# Patient Record
Sex: Male | Born: 1990 | Race: Black or African American | Hispanic: No | Marital: Married | Smoking: Never smoker
Health system: Southern US, Community
[De-identification: ages and names within clinical notes are randomized; demographics above are authoritative.]

## PROBLEM LIST (undated history)

## (undated) DIAGNOSIS — I10 Essential (primary) hypertension: Secondary | ICD-10-CM

## (undated) DIAGNOSIS — R0600 Dyspnea, unspecified: Secondary | ICD-10-CM

## (undated) DIAGNOSIS — Q245 Malformation of coronary vessels: Secondary | ICD-10-CM

## (undated) HISTORY — PX: TONSILLECTOMY: SUR1361

## (undated) HISTORY — DX: Malformation of coronary vessels: Q24.5

---

## 2015-06-24 ENCOUNTER — Emergency Department (HOSPITAL_BASED_OUTPATIENT_CLINIC_OR_DEPARTMENT_OTHER): Payer: Managed Care, Other (non HMO)

## 2015-06-24 ENCOUNTER — Emergency Department (HOSPITAL_COMMUNITY)
Admission: EM | Admit: 2015-06-24 | Discharge: 2015-06-24 | Disposition: A | Discharge status: Home / Self Care | Payer: Managed Care, Other (non HMO) | Attending: Emergency Medicine | Admitting: Emergency Medicine

## 2015-06-24 ENCOUNTER — Encounter (HOSPITAL_COMMUNITY): Payer: Self-pay | Admitting: Emergency Medicine

## 2015-06-24 ENCOUNTER — Emergency Department (HOSPITAL_COMMUNITY): Payer: Managed Care, Other (non HMO)

## 2015-06-24 DIAGNOSIS — R079 Chest pain, unspecified: Secondary | ICD-10-CM | POA: Diagnosis present

## 2015-06-24 DIAGNOSIS — I10 Essential (primary) hypertension: Secondary | ICD-10-CM | POA: Diagnosis not present

## 2015-06-24 DIAGNOSIS — R55 Syncope and collapse: Secondary | ICD-10-CM | POA: Insufficient documentation

## 2015-06-24 DIAGNOSIS — R197 Diarrhea, unspecified: Secondary | ICD-10-CM | POA: Insufficient documentation

## 2015-06-24 HISTORY — DX: Essential (primary) hypertension: I10

## 2015-06-24 LAB — BASIC METABOLIC PANEL
Anion gap: 9 (ref 5–15)
BUN: 20 mg/dL (ref 6–20)
CHLORIDE: 106 mmol/L (ref 101–111)
CO2: 27 mmol/L (ref 22–32)
CREATININE: 1.06 mg/dL (ref 0.61–1.24)
Calcium: 9.6 mg/dL (ref 8.9–10.3)
Glucose, Bld: 95 mg/dL (ref 65–99)
Potassium: 4.2 mmol/L (ref 3.5–5.1)
SODIUM: 142 mmol/L (ref 135–145)

## 2015-06-24 LAB — CBC
HCT: 41.9 % (ref 39.0–52.0)
Hemoglobin: 14.4 g/dL (ref 13.0–17.0)
MCH: 31.3 pg (ref 26.0–34.0)
MCHC: 34.4 g/dL (ref 30.0–36.0)
MCV: 91.1 fL (ref 78.0–100.0)
PLATELETS: 235 10*3/uL (ref 150–400)
RBC: 4.6 MIL/uL (ref 4.22–5.81)
RDW: 12.2 % (ref 11.5–15.5)
WBC: 7.8 10*3/uL (ref 4.0–10.5)

## 2015-06-24 LAB — I-STAT TROPONIN, ED: TROPONIN I, POC: 0 ng/mL (ref 0.00–0.08)

## 2015-06-24 MED ORDER — NAPROXEN 500 MG PO TABS: 500.0000 mg | ORAL_TABLET | Freq: Two times a day (BID) | ORAL | Status: DC

## 2015-06-24 MED ORDER — NAPROXEN 500 MG PO TABS
500.0000 mg | ORAL_TABLET | Freq: Once | ORAL | Status: AC
  Administered 2015-06-24: 500 mg via ORAL
  Filled 2015-06-24: qty 1

## 2015-06-24 MED ORDER — KETOROLAC TROMETHAMINE 30 MG/ML IJ SOLN
30.0000 mg | Freq: Once | INTRAMUSCULAR | Status: AC
  Administered 2015-06-24: 30 mg via INTRAVENOUS
  Filled 2015-06-24: qty 1

## 2015-06-24 NOTE — ED Notes (Signed)
Pt made aware that we are still waiting on ECHO to be read by Cardiologist.  Verbalized understanding.

## 2015-06-24 NOTE — ED Provider Notes (Signed)
10 AM patient is resting comfortably. Complains of mild anterior chest pain, nonradiating. Declines pain medicine. 11:50 PM a.m. pain is unchanged however now requesting pain medicine. Naprosyn ordered. Plan prescription Naprosyn referral [facility] Diagnosis atypical chest pain Results for orders placed or performed during the hospital encounter of 06/24/15  Basic metabolic panel  Result Value Ref Range   Sodium 142 135 - 145 mmol/L   Potassium 4.2 3.5 - 5.1 mmol/L   Chloride 106 101 - 111 mmol/L   CO2 27 22 - 32 mmol/L   Glucose, Bld 95 65 - 99 mg/dL   BUN 20 6 - 20 mg/dL   Creatinine, Ser 1.611.06 0.61 - 1.24 mg/dL   Calcium 9.6 8.9 - 09.610.3 mg/dL   GFR calc non Af Amer >60 >60 mL/min   GFR calc Af Amer >60 >60 mL/min   Anion gap 9 5 - 15  CBC  Result Value Ref Range   WBC 7.8 4.0 - 10.5 K/uL   RBC 4.60 4.22 - 5.81 MIL/uL   Hemoglobin 14.4 13.0 - 17.0 g/dL   HCT 04.541.9 40.939.0 - 81.152.0 %   MCV 91.1 78.0 - 100.0 fL   MCH 31.3 26.0 - 34.0 pg   MCHC 34.4 30.0 - 36.0 g/dL   RDW 91.412.2 78.211.5 - 95.615.5 %   Platelets 235 150 - 400 K/uL  I-stat troponin, ED (not at [facility])  Result Value Ref Range   Troponin i, poc 0.00 0.00 - 0.08 ng/mL   Comment 3           Dg Chest 2 View  06/24/2015  CLINICAL DATA:  Mid left chest pain on and off for 2 weeks. EXAM: CHEST  2 VIEW COMPARISON:  None. FINDINGS: The heart size and mediastinal contours are within normal limits. Both lungs are clear. The visualized skeletal structures are unremarkable. IMPRESSION: Negative chest. Electronically Signed   By: Marnee SpringJonathon  Watts M.D.   On: 06/24/2015 07:11   Echocardiogram report reviewed  Doug SouSam Rithik Odea, MD 06/24/15 1153

## 2015-06-24 NOTE — ED Provider Notes (Addendum)
CSN: [id number]     Arrival date & time 06/24/15  0604 History   First MD Initiated Contact with Patient 06/24/15 [phone]     Chief Complaint  Patient presents with  . Chest Pain      HPI  Patient presents for evaluation of chest pain. He describes left-sided chest pain that is sharp and intermittent. States it feels like "someone is punched me in the chest in my heart". He states it is worse when he lays flat or on his side at night. He states "when I am standing or walking around I'm good".  Seen at [facility] on Friday, 5 days ago after an episode of syncope. He was getting out of his truck at work and had a syncopal episode. Essentially normal workup there. EKG showed LVH. No description of the RST changes. Not tachycardic. Symptoms thought to be orthostasis.  Patient had some diarrhea last weekend and early this week. States more than usual for him. He states he is lactose intolerant and occasional diarrhea feet as dietary indiscretion. However, he feels his diet was normal. No fevers or other infectious symptoms.  Does not have a cough or shortness of breath. He works Environmental managermoving furniture. Has not noticed fatigue.  Patient with a history of hypertension. Started on medications in 2010. 2013 was taken off of medications because of episodes of lightheadedness and low blood pressure readings.  He is not currently medicated.  Past Medical History  Diagnosis Date  . Hypertension    History reviewed. No pertinent past surgical history. Family History  Problem Relation Age of Onset  . Sickle cell anemia Father    Social History  Substance Use Topics  . Smoking status: Never Smoker   . Smokeless tobacco: None  . Alcohol Use: No    Review of Systems  Constitutional: Negative for fever, chills, diaphoresis, appetite change and fatigue.  HENT: Negative for mouth sores, sore throat and trouble swallowing.   Eyes: Negative for visual disturbance.  Respiratory: Negative for cough,  chest tightness, shortness of breath and wheezing.   Cardiovascular: Positive for chest pain.  Gastrointestinal: Negative for nausea, vomiting, abdominal pain, diarrhea and abdominal distention.  Endocrine: Negative for polydipsia, polyphagia and polyuria.  Genitourinary: Negative for dysuria, frequency and hematuria.  Musculoskeletal: Negative for gait problem.  Skin: Negative for color change, pallor and rash.  Neurological: Positive for syncope. Negative for dizziness, light-headedness and headaches.  Hematological: Does not bruise/bleed easily.  Psychiatric/Behavioral: Negative for behavioral problems and confusion.      Allergies  Review of patient's allergies indicates no known allergies.  Home Medications   Prior to Admission medications   Not on File   BP 112/85 mmHg  Pulse 76  Temp(Src) 97.7 F (36.5 C) (Oral)  Resp 18  Ht 5\' 11"  (1.803 m)  Wt 160 lb (72.576 kg)  BMI 22.33 kg/m2  SpO2 97% Physical Exam  Constitutional: He is oriented to person, place, and time. He appears well-developed and well-nourished. No distress.  HENT:  Head: Normocephalic.  Eyes: Conjunctivae are normal. Pupils are equal, round, and reactive to light. No scleral icterus.  Neck: Normal range of motion. Neck supple. No thyromegaly present.  Cardiovascular: Normal rate and regular rhythm.  Exam reveals gallop and S3. Exam reveals no S4 and no friction rub.   No murmur heard. Pulses:      Radial pulses are 3+ on the right side, and 3+ on the left side.  No murmur. No rub in the  supine, left lateral decubitus, or upright/forward leaning position. Sinus rhythm. Not tachycardic. No ectopy on the monitor  Pulmonary/Chest: Effort normal and breath sounds normal. No respiratory distress. He has no wheezes. He has no rales.  Abdominal: Soft. Bowel sounds are normal. He exhibits no distension. There is no tenderness. There is no rebound.  Musculoskeletal: Normal range of motion.  Neurological: He is  alert and oriented to person, place, and time.  Skin: Skin is warm and dry. No rash noted.  Psychiatric: He has a normal mood and affect. His behavior is normal.    ED Course  Procedures (including critical care time) Labs Review Labs Reviewed  CBC  BASIC METABOLIC PANEL  I-STAT TROPOININ, ED    Imaging Review No results found. I have personally reviewed and evaluated these images and lab results as part of my medical decision-making.   EKG Interpretation   Date/Time:  Wednesday June 24 2015 06:13:42 EST Ventricular Rate:  71 PR Interval:  166 QRS Duration: 90 QT Interval:  370 QTC Calculation: 402 R Axis:   80 Text Interpretation:  Sinus rhythm PR depression Early repolarization  pattern vs Pericarditis Reconfirmed by Fayrene Fearing  MD, Navah Grondin ([id number]) on 06/24/2015  6:26:29 AM      MDM   Final diagnoses:  Chest pain, unspecified chest pain type    EKG shows early repo versus concave J point elevation. Has downsloping PR depression.  Could be consistent with pericarditis. Is not tachycardic. Is not febrile.  Considering his recent episode of syncope, the J point in PR findings noted today,while only  LVH described on EKG a week ago, and possible recent viral infection with diarrhea,  I feel that Pericarditis is in the differential. Have asked for ER echocardiogram. X-ray and troponin pending.  Low risk for MI with HEART score 0. No PO risks, PERC negative. Possibly pleurisy. Given IV Toradol.  Await Echo.    Rolland Porter, MD 06/24/15 6045  Rolland Porter, MD 06/24/15 [phone]

## 2015-06-24 NOTE — ED Notes (Signed)
Pt c/o 7/10 chest pain.  However, Pt was sleeping when this RN entered the room.  Plan of care discussed.

## 2015-06-24 NOTE — ED Notes (Addendum)
Verbalized understanding discharge instructions. In no acute distress.  Pt provided a work note.     

## 2015-06-24 NOTE — ED Notes (Signed)
Pt states he is having chest pain  Pt states it started about 2 weeks ago and has been off and on  Pt states the pain is around his heart and feels like someone has punched him in the chest  Pt states he had a syncopal episode last week and was seen for that  Was told his blood pressure was low and they did an EKG that was ok  Pt was taken off his lisinopril and was told to monitor his blood pressure  Pt denies any other sxs than pain

## 2015-06-24 NOTE — ED Notes (Signed)
Patient transported to X-ray 

## 2015-06-24 NOTE — Progress Notes (Signed)
  Echocardiogram 2D Echocardiogram has been performed.  Cathie BeamsGREGORY, Vail Vuncannon 06/24/2015, 9:38 AM

## 2018-01-14 ENCOUNTER — Other Ambulatory Visit: Payer: Self-pay

## 2018-01-14 ENCOUNTER — Emergency Department (HOSPITAL_COMMUNITY)
Admission: EM | Admit: 2018-01-14 | Discharge: 2018-01-14 | Disposition: A | Discharge status: Home / Self Care | Payer: Managed Care, Other (non HMO) | Attending: Emergency Medicine | Admitting: Emergency Medicine

## 2018-01-14 ENCOUNTER — Emergency Department (HOSPITAL_COMMUNITY): Payer: Managed Care, Other (non HMO)

## 2018-01-14 ENCOUNTER — Encounter (HOSPITAL_COMMUNITY): Payer: Self-pay

## 2018-01-14 DIAGNOSIS — R55 Syncope and collapse: Secondary | ICD-10-CM | POA: Insufficient documentation

## 2018-01-14 DIAGNOSIS — R0789 Other chest pain: Secondary | ICD-10-CM | POA: Diagnosis present

## 2018-01-14 DIAGNOSIS — Z91010 Allergy to peanuts: Secondary | ICD-10-CM | POA: Insufficient documentation

## 2018-01-14 LAB — BASIC METABOLIC PANEL
Anion gap: 6 (ref 5–15)
BUN: 7 mg/dL (ref 6–20)
CHLORIDE: 110 mmol/L (ref 98–111)
CO2: 24 mmol/L (ref 22–32)
Calcium: 8.9 mg/dL (ref 8.9–10.3)
Creatinine, Ser: 0.91 mg/dL (ref 0.61–1.24)
GFR calc Af Amer: >60 mL/min (ref >60)
GLUCOSE: 94 mg/dL (ref 70–99)
POTASSIUM: 3.8 mmol/L (ref 3.5–5.1)
Sodium: 140 mmol/L (ref 135–145)

## 2018-01-14 LAB — RAPID HIV SCREEN (HIV 1/2 AB+AG)
HIV 1/2 Antibodies: NONREACTIVE
HIV-1 P24 Antigen - HIV24: NONREACTIVE

## 2018-01-14 LAB — I-STAT TROPONIN, ED: Troponin i, poc: 0 ng/mL (ref 0.00–0.08)

## 2018-01-14 LAB — CBC
HEMATOCRIT: 41.3 % (ref 39.0–52.0)
HEMOGLOBIN: 13.9 g/dL (ref 13.0–17.0)
MCH: 30.8 pg (ref 26.0–34.0)
MCHC: 33.7 g/dL (ref 30.0–36.0)
MCV: 91.6 fL (ref 78.0–100.0)
Platelets: 205 10*3/uL (ref 150–400)
RBC: 4.51 MIL/uL (ref 4.22–5.81)
RDW: 11.7 % (ref 11.5–15.5)
WBC: 4.7 10*3/uL (ref 4.0–10.5)

## 2018-01-14 LAB — D-DIMER, QUANTITATIVE (NOT AT ARMC): D DIMER QUANT: 0.27 ug{FEU}/mL (ref 0.00–0.50)

## 2018-01-14 MED ORDER — KETOROLAC TROMETHAMINE 30 MG/ML IJ SOLN
30.0000 mg | Freq: Once | INTRAMUSCULAR | Status: AC
  Administered 2018-01-14: 30 mg via INTRAVENOUS
  Filled 2018-01-14: qty 1

## 2018-01-14 MED ORDER — METHOCARBAMOL 500 MG PO TABS
1000.0000 mg | ORAL_TABLET | Freq: Once | ORAL | Status: AC
  Administered 2018-01-14: 1000 mg via ORAL
  Filled 2018-01-14: qty 2

## 2018-01-14 MED ORDER — IBUPROFEN 600 MG PO TABS: 600.0000 mg | ORAL_TABLET | Freq: Three times a day (TID) | ORAL | 0 refills | Status: DC

## 2018-01-14 NOTE — ED Triage Notes (Signed)
Pt arrives EMS from home with c/o chest pain started at 12 today sudden onset, sharp. Left sided, non radiating. C/o nausea and shob. Given nitro x1 stats made chesf pain worse and given zofran 4 mg iv nausea with no improvement . Given 324 mg aspirin pta.

## 2018-01-14 NOTE — ED Provider Notes (Signed)
[facility] EMERGENCY DEPARTMENT Provider Note   CSN: [id number] Arrival date & time: 01/14/18  1759     History   Chief Complaint Chief Complaint  Patient presents with  . Near Syncope  . Chest Pain    HPI Jerry Harper is a 27 y.o. male.  HPI Patient presents with left-sided chest pain that is described as stabbing.  Started around noon and associated with shortness of breath.  States that around 530 he went out to his car and then went back inside.  Became lightheaded and collapsed to the floor.  States he did not lose consciousness.  No head or neck trauma.  Continues to have chest pain shortness of breath.  Was given nitroglycerin and aspirin by EMS without improvement.  Endorses nausea but no vomiting.  States he has had similar episodes in the past and has had cardiac work-up including echocardiogram with no acute findings.  Last syncopal episode was 2 years ago.  No smoking history.  Unknown family history. Past Medical History:  Diagnosis Date  . Hypertension     There are no active problems to display for this patient.   History reviewed. No pertinent surgical history.      Home Medications    Prior to Admission medications   Medication Sig Start Date End Date Taking? Authorizing Provider  ibuprofen (ADVIL,MOTRIN) 600 MG tablet Take 1 tablet (600 mg total) by mouth 3 (three) times daily after meals. 01/14/18   Loren Racer, MD  naproxen (NAPROSYN) 500 MG tablet Take 1 tablet (500 mg total) by mouth 2 (two) times daily. Patient not taking: Reported on 01/14/2018 06/24/15   Rolland Porter, MD    Family History Family History  Problem Relation Age of Onset  . Sickle cell anemia Father     Social History Social History   Tobacco Use  . Smoking status: Never Smoker  . Smokeless tobacco: Never Used  Substance Use Topics  . Alcohol use: No  . Drug use: No     Allergies   Peanut-containing drug products   Review of Systems Review  of Systems  Constitutional: Negative for chills, fatigue and fever.  HENT: Negative for sore throat and trouble swallowing.   Eyes: Negative for visual disturbance.  Respiratory: Positive for shortness of breath. Negative for cough.   Cardiovascular: Positive for chest pain. Negative for palpitations and leg swelling.  Gastrointestinal: Positive for nausea. Negative for abdominal pain, constipation, diarrhea and vomiting.  Musculoskeletal: Negative for back pain, myalgias, neck pain and neck stiffness.  Skin: Negative for rash and wound.  Neurological: Positive for light-headedness. Negative for weakness, numbness and headaches.  All other systems reviewed and are negative.    Physical Exam Updated Vital Signs BP 117/86   Pulse 74   Temp 98.4 F (36.9 C) (Oral)   Resp 14   Ht 5\' 11"  (1.803 m)   Wt 86.2 kg (190 lb)   SpO2 100%   BMI 26.50 kg/m   Physical Exam  Constitutional: He is oriented to person, place, and time. He appears well-developed and well-nourished. No distress.  HENT:  Head: Normocephalic and atraumatic.  Mouth/Throat: Oropharynx is clear and moist. No oropharyngeal exudate.  No scalp trauma.  No intraoral trauma.  Cranial nerves II through XII intact.  Eyes: Pupils are equal, round, and reactive to light. EOM are normal.  No nystagmus.  Mildly injected sclera.  Neck: Normal range of motion. Neck supple. No JVD present.  No posterior midline cervical tenderness  to palpation.  No meningismus.  Cardiovascular: Normal rate and regular rhythm. Exam reveals no gallop and no friction rub.  No murmur heard. Pulmonary/Chest: Effort normal and breath sounds normal. No stridor. No respiratory distress. He has no wheezes. He has no rales. He exhibits tenderness.  Chest pain reproduced with palpation of the left sternal border.  No crepitance or deformity.  Abdominal: Soft. Bowel sounds are normal. There is no tenderness. There is no rebound and no guarding.    Musculoskeletal: Normal range of motion. He exhibits no edema or tenderness.  No lower extremity swelling, asymmetry or tenderness.  2+ distal pulses in all extremities.  No midline thoracic or lumbar tenderness.  Lymphadenopathy:    He has no cervical adenopathy.  Neurological: He is alert and oriented to person, place, and time.  5/5 motor in all extremities.  Sensation fully intact.  Skin: Skin is warm and dry. Capillary refill takes less than 2 seconds. No rash noted. He is not diaphoretic. No erythema.  Psychiatric: He has a normal mood and affect. His behavior is normal.  Nursing note and vitals reviewed.    ED Treatments / Results  Labs (all labs ordered are listed, but only abnormal results are displayed) Labs Reviewed  BASIC METABOLIC PANEL  CBC  D-DIMER, QUANTITATIVE (NOT AT [facility])  RAPID HIV SCREEN (HIV 1/2 AB+AG)  HEPATITIS PANEL, ACUTE  I-STAT TROPONIN, ED    EKG EKG Interpretation  Date/Time:  Sunday January 14 2018 18:01:30 EDT Ventricular Rate:  74 PR Interval:    QRS Duration: 86 QT Interval:  368 QTC Calculation: 409 R Axis:   78 Text Interpretation:  Sinus rhythm Confirmed by Lamar Meter ([id number]) on 01/14/2018 6:31:15 PM   Radiology Dg Chest 2 View  Result Date: 01/14/2018 CLINICAL DATA:  Chest pain EXAM: CHEST - 2 VIEW COMPARISON:  06/24/2015 FINDINGS: Heart and mediastinal contours are within normal limits. No focal opacities or effusions. No acute bony abnormality. IMPRESSION: No active cardiopulmonary disease. Electronically Signed   By: Kevin  Dover M.D.   On: 01/14/2018 19:26    Procedures Procedures (including critical care time)  Medications Ordered in ED Medications  ketorolac (TORADOL) 30 MG/ML injection 30 mg (30 mg Intravenous Given 01/14/18 2108)  methocarbamol (ROBAXIN) tablet 1,000 mg (1,000 mg Oral Given 01/14/18 2108)     Initial Impression / Assessment and Plan / ED Course  I have reviewed the triage vital signs and the nursing  notes.  Pertinent labs & imaging results that were available during my care of the patient were reviewed by me and considered in my medical decision making (see chart for details).    Patient has had a recent echoes without acute abnormality.  D-dimer is normal.  EKG without evidence of ischemia.  Low suspicion for CAD.  Advised to follow-up closely with cardiology.  Chest pain is consistent with chest wall etiology.  Will treat with NSAIDs 3 times daily.  Return precautions have been given.   Final Clinical Impressions(s) / ED Diagnoses   Final diagnoses:  Chest wall pain  Near syncope    ED Discharge Orders        Ordered    ibuprofen (ADVIL,MOTRIN) 600 MG tablet  3 times daily after meals     07 /28/19 2126       Loren RacerYelverton, Jamarious Febo, MD 01/14/18 2127

## 2018-01-14 NOTE — ED Notes (Signed)
Pt is at xray.

## 2018-01-15 LAB — HEPATITIS PANEL, ACUTE
HEP A IGM: NEGATIVE
HEP B C IGM: NEGATIVE
Hepatitis B Surface Ag: NEGATIVE

## 2018-01-23 ENCOUNTER — Encounter: Payer: Self-pay | Admitting: Cardiovascular Disease

## 2018-01-23 ENCOUNTER — Ambulatory Visit (INDEPENDENT_AMBULATORY_CARE_PROVIDER_SITE_OTHER): Payer: Managed Care, Other (non HMO) | Admitting: Cardiovascular Disease

## 2018-01-23 VITALS — BP 108/70 | HR 83 | Ht 71.0 in | Wt 183.0 lb

## 2018-01-23 DIAGNOSIS — R079 Chest pain, unspecified: Secondary | ICD-10-CM | POA: Diagnosis not present

## 2018-01-23 MED ORDER — METOPROLOL TARTRATE 50 MG PO TABS: ORAL_TABLET | ORAL | 0 refills | Status: DC

## 2018-01-23 NOTE — Addendum Note (Signed)
Addended by: Virl AxePATE INGALLS, Earnestine Tuohey L on: 01/23/2018 04:03 PM   Modules accepted: Orders

## 2018-01-23 NOTE — Patient Instructions (Addendum)
Medication Instructions:  Your physician recommends that you continue on your current medications as directed. Please refer to the Current Medication list given to you today.  Labwork: NONE  Testing/Procedures: Your physician has requested that you have cardiac CT. Cardiac computed tomography (CT) is a painless test that uses an x-ray machine to take clear, detailed pictures of your heart. For further information please visit [email or web address]. Please follow instruction sheet as given.  Follow-Up: Your physician wants you to follow-up as needed with Dr. Eden EmmsNishan.   If you need a refill on your cardiac medications before your next appointment, please call your pharmacy.  Please [facility] the [facility] main entrance of [facility] at xx:xx AM (30-45 minutes prior to test start time)  [facility] [address] [place], [place] [address] [phone]  Proceed to the [facility] Radiology Department (First Floor).  Please follow these instructions carefully (unless otherwise directed):  Hold all erectile dysfunction medications at least 48 hours prior to test.  On the Night Before the Test: . Drink plenty of water. . Do not consume any caffeinated/decaffeinated beverages or chocolate 12 hours prior to your test. . Do not take any antihistamines 12 hours prior to your test. . Take 50 mg of lopressor (metoprolol) night before test.  On the Day of the Test: . Drink plenty of water. Do not drink any water within one hour of the test. . Do not eat any food 4 hours prior to the test. . You may take your regular medications prior to the test. . IF NOT ON A BETA BLOCKER - Take 50 mg of lopressor (metoprolol) one hour before the test.  After the Test: . Drink plenty of water. . After receiving IV contrast, you may experience a mild flushed feeling. This is normal. . On occasion, you may experience a mild rash up to 24 hours after the test. This is not dangerous. If  this occurs, you can take Benadryl 25 mg and increase your fluid intake. . If you experience trouble breathing, this can be serious. If it is severe call 911 IMMEDIATELY. If it is mild, please call our office. . If you take any of these medications: Glipizide/Metformin, Avandament, Glucavance, please do not take 48 hours after completing test.

## 2018-01-23 NOTE — Progress Notes (Signed)
Cardiology Office Note   Date:  01/23/2018   ID:  Jerry Harper, DOB January 03, 1991, MRN [id number]  PCP:  Patient, [name]  Cardiologist:   Charlton Haws, MD   No chief complaint on file.     History of Present Illness: Jerry Harper is a 27 y.o. male who presents for consultation regarding chest pain. Referred by Dr Narda Bonds ER. Reviewed ER note from 01/14/18  Sudden onset dyspnea and sharp chest pain around 5:30 pm going to his car. Got lightheaded and "collapsed" no loss of consciousness Did having some nausea Similar issues in past with negative cardiac w/u including TTE ASA and nitro given by EMS did not help Chest pain was reproducible to palpation over left sternal border D dimer and ECG normal negative troponin x 2 Rx with NSAI's  CXR showed NAD  TTE reviewed from 06/24/15 normal EF 55-60%   Still with pain at home not responsive to NSAI's   Past Medical History:  Diagnosis Date  . Hypertension     History reviewed. No pertinent surgical history.   Current Outpatient Medications  Medication Sig Dispense Refill  . ibuprofen (ADVIL,MOTRIN) 600 MG tablet Take 1 tablet (600 mg total) by mouth 3 (three) times daily after meals. 30 tablet 0  . naproxen (NAPROSYN) 500 MG tablet Take 1 tablet (500 mg total) by mouth 2 (two) times daily. 30 tablet 0   No current facility-administered medications for this visit.     Allergies:   Peanut-containing drug products    Social History:  The patient  reports that he has never smoked. He has never used smokeless tobacco. He reports that he does not drink alcohol or use drugs.   Family History:  The patient's family history includes Sickle cell anemia in his father.    ROS:  Please see the history of present illness.   Otherwise, review of systems are positive for none.   All other systems are reviewed and negative.    PHYSICAL EXAM: VS:  BP 108/70   Pulse 83   Ht 5\' 11"  (1.803 m)   Wt 183 lb (83 kg)   SpO2 97%    BMI 25.52 kg/m  , BMI Body mass index is 25.52 kg/m. Affect appropriate Healthy:  appears stated age HEENT: normal Neck supple with no adenopathy JVP normal no bruits no thyromegaly Lungs clear with no wheezing and good diaphragmatic motion Heart:  S1/S2 no murmur, no rub, gallop or click PMI normal Abdomen: benighn, BS positve, no tenderness, no AAA no bruit.  No HSM or HJR Distal pulses intact with no bruits No edema Neuro non-focal Skin warm and dry No muscular weakness    EKG:  SR rate 74 normal    Recent Labs: 01/14/2018: BUN 7; Creatinine, Ser 0.91; Hemoglobin 13.9; Platelets 205; Potassium 3.8; Sodium 140    Lipid Panel No results found for: CHOL, TRIG, HDL, CHOLHDL, VLDL, LDLCALC, LDLDIRECT    Wt Readings from Last 3 Encounters:  01/23/18 183 lb (83 kg)  01/14/18 190 lb (86.2 kg)  06/24/15 160 lb (72.6 kg)      Other studies Reviewed: Additional studies/ records that were reviewed today include: Notes ER, labs CXR and ECG .    ASSESSMENT AND PLAN:  1.  Chest pain: atypical r/o normal ECG recurrent favor cardiac CT to r/o CAD and coronary anomaly  2. HTN:  Low sodium diet follow readings at home normal in office today  3. Dyspnea:  Normal exam and  CXR CT will further assess lungs and r/o proximal PE D dimer was negative and normal exam   Lopressor 50 mg for Cardiac CT Cr normal in recent ER visit No contrast allergy   Current medicines are reviewed at length with the patient today.  The patient does not have concerns regarding medicines.  The following changes have been made:  no change  Labs/ tests ordered today include: cardiac CTA  No orders of the defined types were placed in this encounter.    Disposition:   FU with cardiology PRN      Signed, Charlton HawsPeter Junell Cullifer, MD  01/23/2018 3:40 PM    [facility] [address], [place], [place]  [address] Phone: [phone](336) [phone]; Fax: [phone]) [phone]

## 2018-01-25 ENCOUNTER — Telehealth: Payer: Self-pay | Admitting: Cardiovascular Disease

## 2018-01-25 NOTE — Telephone Encounter (Signed)
Will send to Dr. Nishan for advisement. 

## 2018-01-25 NOTE — Telephone Encounter (Signed)
New Message:      Pt's states he needs a letter that states he is okay to return to work before he has this CT Scan. Pt states someone called his job and told them he can't work until he has this test done.

## 2018-01-26 NOTE — Telephone Encounter (Signed)
Follow up  ° ° °Patient is returning call.  °

## 2018-01-26 NOTE — Telephone Encounter (Signed)
Tried to call Patient's number, it is not working. Called patient's alternative number and left message for patient to call back.

## 2018-01-26 NOTE — Telephone Encounter (Signed)
Called patient back. Informed him of Dr. Eden EmmsNishan recommendations. Wrote a note for patient to take to work. Left note at front desk for patient to pick up.

## 2018-01-26 NOTE — Telephone Encounter (Signed)
Ok to return to work before CT nobody called his work place !!

## 2018-02-08 ENCOUNTER — Ambulatory Visit (HOSPITAL_COMMUNITY): Admission: RE | Admit: 2018-02-08 | Payer: Managed Care, Other (non HMO) | Source: Ambulatory Visit

## 2018-02-08 ENCOUNTER — Ambulatory Visit (HOSPITAL_COMMUNITY): Payer: Managed Care, Other (non HMO)

## 2018-02-15 ENCOUNTER — Telehealth: Payer: Self-pay

## 2018-02-15 MED ORDER — METOPROLOL TARTRATE 50 MG PO TABS: ORAL_TABLET | ORAL | 0 refills | Status: DC

## 2018-02-15 NOTE — Telephone Encounter (Signed)
Jerry Harper, Sharon B  P [facility] Heartcare Pre Cert/Auth; Ethelda ChickPate Ingalls, Reah Justo, RN        Patient was schedule for today @ 1:30 - Had a conflict with schedule - has r/s for 9-16 - Will need new rx for Lopressor 50 mg- (took med today before conflict come up).

## 2018-03-05 ENCOUNTER — Ambulatory Visit (HOSPITAL_COMMUNITY)
Admission: RE | Admit: 2018-03-05 | Discharge: 2018-03-05 | Disposition: A | Discharge status: Home / Self Care | Payer: Managed Care, Other (non HMO) | Source: Ambulatory Visit | Attending: Cardiovascular Disease | Admitting: Cardiovascular Disease

## 2018-03-05 ENCOUNTER — Encounter (HOSPITAL_COMMUNITY): Payer: Self-pay

## 2018-03-05 ENCOUNTER — Ambulatory Visit (HOSPITAL_COMMUNITY): Payer: Managed Care, Other (non HMO)

## 2018-03-05 DIAGNOSIS — Q245 Malformation of coronary vessels: Secondary | ICD-10-CM | POA: Diagnosis not present

## 2018-03-05 DIAGNOSIS — R079 Chest pain, unspecified: Secondary | ICD-10-CM

## 2018-03-05 MED ORDER — METOPROLOL TARTRATE 5 MG/5ML IV SOLN
INTRAVENOUS | Status: AC
  Administered 2018-03-05: 15 mg
  Filled 2018-03-05: qty 15

## 2018-03-05 MED ORDER — NITROGLYCERIN 0.4 MG SL SUBL
SUBLINGUAL_TABLET | SUBLINGUAL | Status: AC
  Administered 2018-03-05: 0.08 mg
  Filled 2018-03-05: qty 1

## 2018-03-05 MED ORDER — IOPAMIDOL (ISOVUE-370) INJECTION 76%
INTRAVENOUS | Status: AC
  Administered 2018-03-05: 80 mL
  Filled 2018-03-05: qty 100

## 2018-03-06 ENCOUNTER — Telehealth: Payer: Self-pay

## 2018-03-06 NOTE — Progress Notes (Signed)
Cardiology Office Note   Date:  03/14/2018   ID:  Jerry Harper, DOB July 29, 1990, MRN [id number]  PCP:  Jerry Harper  Cardiologist:   Charlton HawsPeter Carmelina Balducci, MD   No chief complaint on file.     History of Present Illness: Jerry Harper is a 27 y.o. male who presents for f/u regarding chest pain First seen by me 01/23/18 . Referred by Dr Narda BondsYelverton Harrison. Reviewed ER note from 01/14/18  Sudden onset dyspnea and sharp chest pain around 5:30 pm going to his car. Got lightheaded and "collapsed" no loss of consciousness Did having some nausea Similar issues in past with negative cardiac w/u including TTE ASA and nitro given by EMS did not help Chest pain was reproducible to palpation over left sternal border D dimer and ECG normal negative troponin x 2 Rx with NSAI's  CXR showed NAD  TTE reviewed from 06/24/15 normal EF 55-60%   Still with pain at home not responsive to NSAI's   F/U cardiac CT reviewed. 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   In talking with him he has had chest pain, dyspnea and pre syncopal episodes since 2011  Long discussion with him and wife regarding diagnosis. 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 And acute take off angle. Also discussed various methods of surgical correction including relocation, by pass and un-roofing.   Used heart models and showed him 3D images from his CTA   Past Medical History:  Diagnosis Date  . Hypertension     History reviewed. No pertinent surgical history.   Current Outpatient Medications  Medication Sig Dispense Refill  . naproxen (NAPROSYN) 500 MG tablet Take 1 tablet (500 mg total) by mouth 2 (two) times daily. 30 tablet 0   No  current facility-administered medications for this visit.     Allergies:   Peanut-containing drug products    Social History:  The patient  reports that he has never smoked. He has never used smokeless tobacco. He reports that he does not drink alcohol or use drugs.   Family History:  The patient's family history includes Sickle cell anemia in his father.    ROS:  Please see the history of present illness.   Otherwise, review of systems are positive for none.   All other systems are reviewed and negative.    PHYSICAL EXAM: VS:  BP 138/84   Pulse 67   Ht 5\' 11"  (1.803 m)   Wt 181 lb (82.1 kg)   SpO2 99%   BMI 25.24 kg/m  , BMI Body mass index is 25.24 kg/m. Affect appropriate Healthy:  appears stated age HEENT: normal Neck supple with no adenopathy JVP normal no bruits no thyromegaly Lungs clear with no wheezing and good diaphragmatic motion Heart:  S1/S2 no murmur, no rub, gallop or click PMI normal Abdomen: benighn, BS positve, no tenderness, no AAA no bruit.  No HSM or HJR Distal pulses intact with no bruits No edema Neuro non-focal Skin warm and dry No muscular weakness    EKG:  SR rate 74 normal    Recent Labs: 01/14/2018: BUN 7; Creatinine, Ser 0.91; Hemoglobin 13.9; Platelets 205; Potassium 3.8; Sodium 140    Lipid Panel  No results found for: CHOL, TRIG, HDL, CHOLHDL, VLDL, LDLCALC, LDLDIRECT    Wt Readings from Last 3 Encounters:  03/14/18 181 lb (82.1 kg)  01/23/18 183 lb (83 kg)  01/14/18 190 lb (86.2 kg)      Other studies Reviewed: Additional studies/ records that were reviewed today include: Notes ER, labs CXR and ECG . Cardiac CTA 03/05/18     ASSESSMENT AND PLAN:  1.  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 Will seek surgical opinion after stress testing.  Will add nitrates to try and prevent spasm and continue beta blocker Discussed no strenuous exercise precautions  2. HTN:  Low sodium diet follow readings at home normal in office today  3. Dyspnea:  Normal exam and CXR CT with no lung findings observe     Current medicines are reviewed at length with the patient today.  The patient does not have concerns regarding medicines.  The following changes have been made:  no change  Labs/ tests ordered today include: Exercise myovue   Orders Placed This Encounter  Procedures  . Ambulatory referral to Cardiothoracic Surgery  . MYOCARDIAL PERFUSION IMAGING     Disposition:   FU with cardiology PRN      Signed, Charlton Haws, MD  03/14/2018 11:53 AM    [facility] [address] [place], [place], [place]  [address] Phone: [phone]; Fax: [phone]

## 2018-03-06 NOTE — Telephone Encounter (Signed)
Notes recorded by Sigurd Sosapp, Kaisyn Millea, RN on 03/06/2018 at 8:27 AM EDT LPMTCB 9/17 ------

## 2018-03-06 NOTE — Telephone Encounter (Signed)
-----   Message from Ethelda ChickPamela Pate Ingalls, RN sent at 03/05/2018  5:59 PM EDT ----- Left message for patient to call back. Will route to triage to see if they can help with reaching patient tomorrow.

## 2018-03-14 ENCOUNTER — Encounter: Payer: Self-pay | Admitting: Cardiovascular Disease

## 2018-03-14 ENCOUNTER — Ambulatory Visit (INDEPENDENT_AMBULATORY_CARE_PROVIDER_SITE_OTHER): Payer: Managed Care, Other (non HMO) | Admitting: Cardiovascular Disease

## 2018-03-14 VITALS — BP 138/84 | HR 67 | Ht 71.0 in | Wt 181.0 lb

## 2018-03-14 DIAGNOSIS — R079 Chest pain, unspecified: Secondary | ICD-10-CM | POA: Diagnosis not present

## 2018-03-14 DIAGNOSIS — Q245 Malformation of coronary vessels: Secondary | ICD-10-CM

## 2018-03-14 NOTE — Patient Instructions (Signed)
Medication Instructions:  Your physician recommends that you continue on your current medications as directed. Please refer to the Current Medication list given to you today.  Labwork: NONE  Testing/Procedures: Your physician has requested that you have en exercise stress myoview. For further information please visit [email or web address]. Please follow instruction sheet, as given.  Follow-Up: You have been referred to Dr. Cornelius Moraswen.  Your physician wants you to follow-up in: 6 months with Dr. Eden EmmsNishan. You will receive a reminder letter in the mail two months in advance. If you don't receive a letter, please call our office to schedule the follow-up appointment.   If you need a refill on your cardiac medications before your next appointment, please call your pharmacy.

## 2018-03-19 ENCOUNTER — Telehealth (HOSPITAL_COMMUNITY): Payer: Self-pay | Admitting: *Deleted

## 2018-03-19 NOTE — Telephone Encounter (Signed)
Left message on voicemail in reference to upcoming appointment scheduled for 03/21/18 Phone number given for a call back so details instructions can be given. Jerry Harper   

## 2018-03-20 ENCOUNTER — Telehealth (HOSPITAL_COMMUNITY): Payer: Self-pay | Admitting: *Deleted

## 2018-03-20 NOTE — Telephone Encounter (Signed)
Patient given detailed instructions per Myocardial Perfusion Study Information Sheet for the test on 03/21/18. Patient notified to arrive 15 minutes early and that it is imperative to arrive on time for appointment to keep from having the test rescheduled.  If you need to cancel or reschedule your appointment, please call the office within 24 hours of your appointment. . Patient verbalized understanding. E Nehemiah Massed

## 2018-03-21 ENCOUNTER — Ambulatory Visit (HOSPITAL_COMMUNITY): Payer: Managed Care, Other (non HMO) | Attending: Cardiology

## 2018-03-21 VITALS — Ht 71.0 in | Wt 181.0 lb

## 2018-03-21 DIAGNOSIS — I10 Essential (primary) hypertension: Secondary | ICD-10-CM | POA: Insufficient documentation

## 2018-03-21 DIAGNOSIS — R079 Chest pain, unspecified: Secondary | ICD-10-CM | POA: Diagnosis present

## 2018-03-21 DIAGNOSIS — Q245 Malformation of coronary vessels: Secondary | ICD-10-CM | POA: Diagnosis not present

## 2018-03-21 DIAGNOSIS — R0609 Other forms of dyspnea: Secondary | ICD-10-CM | POA: Diagnosis not present

## 2018-03-21 DIAGNOSIS — R55 Syncope and collapse: Secondary | ICD-10-CM | POA: Insufficient documentation

## 2018-03-21 LAB — MYOCARDIAL PERFUSION IMAGING
CHL CUP NUCLEAR SRS: 1
CHL CUP NUCLEAR SSS: 3
CSEPHR: 94 %
CSEPPHR: 181 {beats}/min
Estimated workload: 13.4 METS
Exercise duration (min): 11 min
LV dias vol: 94 mL (ref 62–150)
LVSYSVOL: 46 mL
MPHR: 192 {beats}/min
NUC STRESS TID: 0.99
RPE: 18
Rest HR: 79 {beats}/min
SDS: 2

## 2018-03-21 MED ORDER — TECHNETIUM TC 99M TETROFOSMIN IV KIT
30.0000 | PACK | Freq: Once | INTRAVENOUS | Status: AC | PRN
  Administered 2018-03-21: 30 via INTRAVENOUS
  Filled 2018-03-21: qty 30

## 2018-03-21 MED ORDER — TECHNETIUM TC 99M TETROFOSMIN IV KIT
10.4000 | PACK | Freq: Once | INTRAVENOUS | Status: AC | PRN
  Administered 2018-03-21: 10.4 via INTRAVENOUS
  Filled 2018-03-21: qty 11

## 2018-04-05 ENCOUNTER — Encounter: Payer: Self-pay | Admitting: Thoracic Surgery (Cardiothoracic Vascular Surgery)

## 2018-04-05 ENCOUNTER — Institutional Professional Consult (permissible substitution) (INDEPENDENT_AMBULATORY_CARE_PROVIDER_SITE_OTHER): Payer: Self-pay | Admitting: Thoracic Surgery (Cardiothoracic Vascular Surgery)

## 2018-04-05 ENCOUNTER — Other Ambulatory Visit: Payer: Self-pay

## 2018-04-05 VITALS — BP 124/84 | HR 94 | Resp 18 | Ht 71.0 in | Wt 187.2 lb

## 2018-04-05 DIAGNOSIS — Q245 Malformation of coronary vessels: Secondary | ICD-10-CM

## 2018-04-05 DIAGNOSIS — I10 Essential (primary) hypertension: Secondary | ICD-10-CM | POA: Insufficient documentation

## 2018-04-05 NOTE — Progress Notes (Signed)
[address]       [name] [address]             [phone]     CARDIOTHORACIC SURGERY CONSULTATION REPORT  Referring Provider is Wendall Stade, MD PCP is Patient, [name]  Chief Complaint  Patient presents with  . New Patient (Initial Visit)    new patient consultation, chest pain, right anomalous coronary artery, Cardiac CT 03/05/18    HPI:  Patient is a 27 year old African-American male with history of exertional chest pain, shortness of breath, dizziness, and syncope who has been referred for surgical consultation to discuss treatment options for management of anomalous right coronary artery.  Patient states that he first began to experience symptoms of exertional chest pain in 2011.  He describes occasional episodes of "tearing" pain across his chest that is usually associated with strenuous physical exertion and relieved by rest.  Symptoms do not always occur when he is exerting himself, but they have become problematic and fairly frequent.  The patient also experiences shortness of breath, although the shortness of breath does not necessarily coincide with episodes of chest pain or strenuous activity.  The patient has occasional palpitations dizzy spells.  He has had at least 2 syncopal episodes.   Syncopal episodes were not preceded by strenuous activity.  His first syncopal episode occurred in December 2016.  1 month later he underwent an echocardiogram that was normal. The patient has been seen in the emergency room on several occasions, each time with normal EKGs and blood work.  Troponins have always been 0.  He saw a cardiologist in 2018 and reportedly underwent another echocardiogram that was reportedly normal.  Routine chest x-rays have been normal.  Patient presented to the emergency room January 14, 2018 with another episode of chest pain that was sudden onset and associated with lightheadedness although the patient did not completely lose  conscious.  He was given aspirin and nitroglycerin by EMS but symptoms were not relieved.  EKG was normal and troponins negative.  Chest x-ray was clear.  Patient was referred to Dr. Eden Emms and subsequently underwent cardiac gated CT angiogram of the heart.  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  This was associated with a course that ran between the aorta and the pulmonary artery and felt potentially a set up for compression of the first portion of the right coronary artery and a potential source of angina and/or myocardial ischemia.  A nuclear stress test was performed and reportedly low risk.  Cardiothoracic surgical consultation was requested.  Patient is married and [place].  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  He stopped work after his most recent episode of severe chest pain last July.  He reports occasional episodes of chest pain that are usually brought on with exertion and relieved by rest.  He reports shortness of breath that is not necessarily associated with exertion or chest pain.  He reports no other significant physical limitations.   Past Medical History:  Diagnosis Date  . Anomalous origin of right coronary artery   . Hypertension     History reviewed. No pertinent surgical history.  Family History  Problem Relation Age of Onset  . Sickle cell anemia Father     Social History   Socioeconomic History  . Marital  status: Married    Spouse name: Not on file  . Number of children: Not on file  . Years of education: Not on file  . Highest education level: Not on file  Occupational History  . Not on file  Social Needs  . Financial resource strain: Not on file  . Food insecurity:    Worry: Not on file    Inability: Not on file  . Transportation needs:    Medical: Not on file    Non-medical: Not  on file  Tobacco Use  . Smoking status: Never Smoker  . Smokeless tobacco: Never Used  Substance and Sexual Activity  . Alcohol use: No  . Drug use: No  . Sexual activity: Not on file  Lifestyle  . Physical activity:    Days per week: Not on file    Minutes per session: Not on file  . Stress: Not on file  Relationships  . Social connections:    Talks on phone: Not on file    Gets together: Not on file    Attends religious service: Not on file    Active member of club or organization: Not on file    Attends meetings of clubs or organizations: Not on file    Relationship status: Not on file  . Intimate partner violence:    Fear of current or ex partner: Not on file    Emotionally abused: Not on file    Physically abused: Not on file    Forced sexual activity: Not on file  Other Topics Concern  . Not on file  Social History Narrative  . Not on file    No current outpatient medications on file.   No current facility-administered medications for this visit.     Allergies  Allergen Reactions  . Peanut-Containing Drug Products Hives and Swelling    Throat swelling      Review of Systems:   General:  normal appetite, normal energy, no weight gain, no weight loss, no fever  Cardiac:  + chest pain with exertion, no chest pain at rest, +SOB with exertion, occasional resting SOB, no PND, no orthopnea, + palpitations, no arrhythmia, no atrial fibrillation, no LE edema, + dizzy spells, + syncope  Respiratory:  + shortness of breath, no home oxygen, no productive cough, no dry cough, no bronchitis, no wheezing, no hemoptysis, no asthma, no pain with inspiration or cough, no sleep apnea, no CPAP at night  GI:   no difficulty swallowing, no reflux, no frequent heartburn, no hiatal hernia, no abdominal pain, no constipation, no diarrhea, no hematochezia, no hematemesis, no melena  GU:   no dysuria,  no frequency, no urinary tract infection, no hematuria, no enlarged prostate, no  kidney stones, no kidney disease  Vascular:  no pain suggestive of claudication, no pain in feet, no leg cramps, no varicose veins, no DVT, no non-healing foot ulcer  Neuro:   no stroke, no TIA's, no seizures, no headaches, no temporary blindness one eye,  no slurred speech, no peripheral neuropathy, no chronic pain, no instability of gait, no memory/cognitive dysfunction  Musculoskeletal: no arthritis, no joint swelling, no myalgias, no difficulty walking, normal mobility   Skin:   no rash, no itching, no skin infections, no pressure sores or ulcerations  Psych:   no anxiety, no depression, no nervousness, no unusual recent stress  Eyes:   no blurry vision, no floaters, no recent vision changes, + wears glasses or contacts  ENT:   no hearing loss,  no loose or painful teeth, no dentures  Hematologic:  no easy bruising, no abnormal bleeding, no clotting disorder, no frequent epistaxis  Endocrine:  no diabetes, does not check CBG's at home     Physical Exam:   BP 124/84 (BP Location: Right Arm, Patient Position: Sitting, Cuff Size: Normal)   Pulse 94   Resp 18   Ht 5\' 11"  (1.803 m)   Wt 187 lb 3.2 oz (84.9 kg)   SpO2 96% Comment: ra  BMI 26.11 kg/m   General:    well-appearing  HEENT:  Unremarkable   Neck:   no JVD, no bruits, no adenopathy   Chest:   clear to auscultation, symmetrical breath sounds, no wheezes, no rhonchi   CV:   RRR, no  murmur   Abdomen:  soft, non-tender, no masses   Extremities:  warm, well-perfused, pulses palpable, no LE edema  Rectal/GU  Deferred  Neuro:   Grossly non-focal and symmetrical throughout  Skin:   Clean and dry, no rashes, no breakdown   Diagnostic Tests:  Transthoracic Echocardiography  Patient:    Jshaun, Abernathy MR #:       [id number] Study Date: 06/24/2015 Gender:     M Age:        24 Height:     180.3 cm Weight:     72.6 kg BSA:        1.91 m^2 Pt. Status: Room:   ATTENDING    Doug Sou [id number]  SONOGRAPHER  Cathie Beams  [place] [id number]  PERFORMING   [facility], Inpatient  cc:  ------------------------------------------------------------------- LV EF: 55% -   60%  ------------------------------------------------------------------- Indications:      Chest pain 786.51.  ------------------------------------------------------------------- History:   PMH:   Syncope.  Risk factors:  Hypertension.  ------------------------------------------------------------------- Study Conclusions  - Left ventricle: The cavity size was normal. Wall thickness was   normal. Systolic function was normal. The estimated ejection   fraction was in the range of 55% to 60%. Wall motion was normal;   there were no regional wall motion abnormalities. Left   ventricular diastolic function parameters were normal.  Transthoracic echocardiography.  M-mode, complete 2D, spectral Doppler, and color Doppler.  Birthdate:  Patient birthdate: [birth year].  Age:  Patient is 27 yr old.  Sex:  Gender: male. BMI: 22.3 kg/m^2.  Blood pressure:     103/88  Patient status: Inpatient.  Study date:  Study date: 06/24/2015. Study time: 08:55 AM.  Location:  Emergency department.  -------------------------------------------------------------------  ------------------------------------------------------------------- Left ventricle:  The cavity size was normal. Wall thickness was normal. Systolic function was normal. The estimated ejection fraction was in the range of 55% to 60%. Wall motion was normal; there were no regional wall motion abnormalities. The transmitral flow pattern was normal. The deceleration time of the early transmitral flow velocity was normal. The pulmonary vein flow pattern was normal. The tissue Doppler parameters were normal. Left ventricular diastolic function parameters were normal.  ------------------------------------------------------------------- Aortic valve:   Structurally normal  valve. Trileaflet. Cusp separation was normal.  Doppler:  Transvalvular velocity was within the normal range. There was no stenosis. There was no regurgitation.  ------------------------------------------------------------------- Aorta:  The aorta was normal, not dilated, and non-diseased.  ------------------------------------------------------------------- Mitral valve:   Structurally normal valve.   Leaflet separation was normal.  Doppler:  Transvalvular velocity was within the normal range. There was no evidence for stenosis. There was no regurgitation.  ------------------------------------------------------------------- Left atrium:  The atrium was normal  in size.  ------------------------------------------------------------------- Right ventricle:  The cavity size was normal. Wall thickness was normal. Systolic function was normal.  ------------------------------------------------------------------- Pulmonic valve:    Structurally normal valve.   Cusp separation was normal.  Doppler:  Transvalvular velocity was within the normal range. There was no regurgitation.  ------------------------------------------------------------------- Tricuspid valve:   Doppler:  There was mild regurgitation.  ------------------------------------------------------------------- Right atrium:  The atrium was at the upper limits of normal in size.  ------------------------------------------------------------------- Pericardium:  There was no pericardial effusion.  ------------------------------------------------------------------- Systemic veins: Inferior vena cava: The vessel was normal in size. The respirophasic diameter changes were in the normal range (>= 50%), consistent with normal central venous pressure.  ------------------------------------------------------------------- Post procedure conclusions Ascending Aorta:  - The aorta was normal, not dilated, and  non-diseased.  ------------------------------------------------------------------- Measurements   Left ventricle                           Value        Reference  LV ID, ED, PLAX chordal        (L)       40.4  mm     43 - 52  LV ID, ES, PLAX chordal                  28.4  mm     23 - 38  LV fx shortening, PLAX chordal           30    %      >=29  LV PW thickness, ED                      9.22  mm     ---------  IVS/LV PW ratio, ED                      1.17         <=1.3  LV e&', lateral                           12.9  cm/s   ---------  LV E/e&', lateral                         5.26         ---------  LV e&', medial                            13.8  cm/s   ---------  LV E/e&', medial                          4.91         ---------  LV e&', average                           13.35 cm/s   ---------  LV E/e&', average                         5.08         ---------    Ventricular septum                       Value        Reference  IVS thickness, ED  10.8  mm     ---------    LVOT                                     Value        Reference  LVOT ID, S                               20    mm     ---------  LVOT area                                3.14  cm^2   ---------    Aorta                                    Value        Reference  Aortic root ID, ED                       29    mm     ---------    Left atrium                              Value        Reference  LA ID, A-P, ES                           27    mm     ---------  LA ID/bsa, A-P                           1.42  cm/m^2 <=2.2  LA volume, S                             47.4  ml     ---------  LA volume/bsa, S                         24.9  ml/m^2 ---------  LA volume, ES, 1-p A4C                   51.3  ml     ---------  LA volume/bsa, ES, 1-p A4C               26.9  ml/m^2 ---------  LA volume, ES, 1-p A2C                   43.1  ml     ---------  LA volume/bsa, ES, 1-p A2C               22.6  ml/m^2  ---------    Mitral valve                             Value        Reference  Mitral E-wave peak velocity              67.8  cm/s   ---------  Mitral A-wave peak velocity  47    cm/s   ---------  Mitral deceleration time                 204   ms     150 - 230  Mitral E/A ratio, peak                   1.4          ---------    Right ventricle                          Value        Reference  RV s&', lateral, S                        12    cm/s   ---------  Legend: (L)  and  (H)  mark values outside specified reference range.  ------------------------------------------------------------------- Prepared and Electronically Authenticated by  Cassell Clement, MD 2017-01-04T11:28:32    Cardiac CTA  MEDICATIONS: Sub lingual nitro. 4mg  and lopressor 10mg   TECHNIQUE: The patient was scanned on a Siemens Force 192 slice scanner. Gantry rotation speed was 250 msecs. Collimation was. 6 mm . A 100 kV prospective scan was triggered in the ascending thoracic aorta at 140 HU's with full mA between 30-70% R-R interval. Average HR during the scan was 69 bpm. The 3D data set was interpreted on a dedicated work station using NPR, MIP and VRT modes. A total of 80 cc of contrast was used.  FINDINGS: Non-cardiac: See separate report from [facility]. No significant findings on limited lung and soft tissue windows.  Calcium Score: No calcium detected  Coronary Arteries: Right dominant  LM: Normal comes off left cusp  LAD: Normal  D1: Normal  D2: Normal  D3: Normal  Circumflex: Normal  OM1: Normal  AV Groove: Normal  RCA: Appears to have an anomalous origin from the left cusp either abutting or common ostium to LM. Proximal vessel somewhat slit like and runs between the MPA and aorta  PDA: Normal  PLA: Normal  IMPRESSION: 1. Calcium score 0  2.  Normal aortic root 2.9 cm  3. Anomalous RCA origin from left cusp abutting or  common ostium to LM Course does run between the MPA and aortic root suggesting possible source of angina/chest pain  Will review with colleagues  Charlton Haws   Electronically Signed   By: Charlton Haws M.D.   On: 03/05/2018 17:13    Result Notes for MYOCARDIAL PERFUSION IMAGING   Notes recorded by Ethelda Chick, RN on 03/21/2018 at 2:46 PM EDT Patient aware of myoview. Per Dr. Eden Emms, No evidence of inferior wall ischemia normal EF good Normal ECG response to exercise. Still needs f/u with Dr Cornelius Moras to discuss Rx anomalous RCA. Patient verbalized understanding and will keep his appointment with Dr. Cornelius Moras on 10/17. ------  Notes recorded by Wendall Stade, MD on 03/21/2018 at 12:44 PM EDT No evidence of inferior wall ischemia normal EF good Normal ECG response to exercise. Still needs f/u with Dr Cornelius Moras to discuss Rx anomalous RCA      Vitals   Height Weight BMI (Calculated)  5\' 11"  (1.803 m) 187 lb 3.2 oz (84.9 kg) 26.12  Study Highlights    The left ventricular ejection fraction is normal (55-65%).  Nuclear stress EF is calculated at 52% but visually appears to be 60%.  Blood pressure demonstrated a normal response  to exercise.  Baseline EKG showed NSR with LVH and early repolarization abnormality. During infusino there were T wave inversions in the inferolateral leads.  The study is normal.  This is a low risk study.    Nuclear History and Indications   History and Indications Indication for Stress Test: Risk stratification /preoperative History: 7/19 ER- Presyncope, chest pain, dyspnea, Abnormal CT with anomalous coronary artery anatomy Cardiac Risk Factors: Hypertension  Symptoms: Chest Pain, DOE and Near Syncope  Stress Findings   ECG Baseline ECG exhibits normal sinus rhythm.Baseline ECG indicates non-specific ST-T wave changes. NSR with LVh and early repolarization .  Stress Findings The patient exercised following the Bruce protocol.  The patient  reported dyspnea and fatigue during the stress test. The patient experienced no angina during the stress test.   The test was stopped because the patient complained of fatigue.   Blood pressure and heart rate demonstrated a normal response to exercise. Blood pressure demonstrated a normal response to exercise. Overall, the patient's exercise capacity was excellent.   The patient's response to exercise was adequate for diagnosis.  Response to Stress Arrhythmias during stress: none.  Arrhythmias during recovery: none.  There were no significant arrhythmias noted during the test.  ECG was interpretable and conclusive.  Stress Measurements   Baseline Vitals  Rest HR 79 bpm    Rest BP 102/73 mmHg    Exercise Time  Exercise duration (min) 11 min    Peak Stress Vitals  Peak HR 181 bpm    Peak BP 157/78 mmHg    Exercise Data  MPHR 192 bpm    Percent HR 94 %    RPE 18     Estimated workload 13.4 METS       Nuclear Stress Measurements   LV sys vol 46 mL    TID 0.99     LV dias vol 94 mL    SSS 3     SRS 1     SDS 2          Nuclear Stress Findings   Isotope administration Rest isotope was administered with an IV injection of 10.4 mCi Tc64m Tetrofosmin. Rest SPECT images were obtained approximately 45 minutes post tracer injection. Stress isotope was administered with an IV injection of 31.5 mCi Tc72m Tetrofosmin at peak exercise.  Nuclear Measurements Study was gated.  Overall Study Impression Myocardial perfusion is normal. The study is normal. This is a low risk study. Overall left ventricular systolic function was normal. LV cavity size is normal. Nuclear stress EF: 52%. The left ventricular ejection fraction is normal (55-65%). There is no prior study for comparison.  From: ACCF/SCAI/STS/AATS/AHA/ASNC/HFSA/SCCT 2012 Appropriate Use Criteria for Coronary Revascularization Focused Update  Wall Scoring   Score Index: 1.000 Percent Normal: 100.0%           The left  ventricular wall motion is normal.          Resulted by:   Signed Date/Time  Phone Pager  Armanda Magic R 03/21/2018 11:25 AM [phone]   Report approved and finalized on 03/21/2018 1125      Impression:  I have personally reviewed the patient's recent cardiac gated CT angiogram of the heart with Dr. Eden Emms.  Patient has a anomalous right coronary artery with origin off of the left sinus of Valsalva.  The vessel courses intra-arterial at an acute angle between the pulmonary artery and the aorta.  He has right dominant coronary circulation.  Left coronary circulation is entirely normal.  There is no calcification and no other sign of atherosclerotic disease.  The aortic valve and aortic root appear normal.  No other significant abnormalities are noted.  Typically patients with anomalous right coronary artery are felt to be at low risk for sudden death or acute myocardial infarction and therefore are not treated surgically.  However, the patient continues to have symptoms which are highly suspicious, and he has reportedly had 2 frank syncopal episodes.  Risks associated with surgery should be extremely low in this relatively young otherwise healthy male.  I agree that it seems reasonable to consider coronary revascularization, although there is some risk that the patient's symptoms may not be relieved.   Plan:  I have reviewed the results of the patient's recent cardiac gated CT angiogram with the patient in the office today.  We discussed treatment alternatives including continued nonoperative therapy versus surgical intervention for possible coronary artery bypass grafting and/or unroofing or rerouting of the proximal right coronary artery.  Risks associated with surgery were discussed.  Expectations for the patient's postoperative convalescence were discussed.  The patient hopes to proceed with surgery in the near future.  We tentatively plan to proceed with surgery on May 04, 2018.   The patient and his wife will return to our office for follow-up prior to surgery on April 30, 2018.  All questions answered.   I spent in excess of 90 minutes during the conduct of this office consultation and >50% of this time involved direct face-to-face encounter with the patient for counseling and/or coordination of their care.    Salvatore Decent. Cornelius Moras, MD 04/05/2018 4:36 PM

## 2018-04-06 ENCOUNTER — Encounter: Payer: Self-pay | Admitting: *Deleted

## 2018-04-06 ENCOUNTER — Other Ambulatory Visit: Payer: Self-pay | Admitting: *Deleted

## 2018-04-06 DIAGNOSIS — I2510 Atherosclerotic heart disease of native coronary artery without angina pectoris: Secondary | ICD-10-CM

## 2018-04-27 NOTE — Pre-Procedure Instructions (Signed)
Jerry Harper  04/27/2018      [facility] DRUG STORE #[id number] [facility], [place] - [phone] AT [facility] [address] Jerry Harper [place] [address] Phone: [phone] Fax: [phone]    Your procedure is scheduled on Nov. 15.  Report to [facility] Admitting at 5:30  A.M.  Call this number if you have problems the morning of surgery:  [phone]   Remember:  Do not eat or drink after midnight.      Take these medicines the morning of surgery with A SIP OF WATER :    Do not wear jewelry.  Do not wear lotions, powders, or perfumes, or deodorant.  Do not shave 48 hours prior to surgery.  Men may shave face and neck.  Do not bring valuables to the hospital.  [facility] is not responsible for any belongings or valuables.  Contacts, dentures or bridgework may not be worn into surgery.  Leave your suitcase in the car.  After surgery it may be brought to your room.  For patients admitted to the hospital, discharge time will be determined by your treatment team.  Patients discharged the day of surgery will not be allowed to drive home.    Special instructions:   [place]- Preparing For Surgery  Before surgery, you can play an important role. Because skin is not sterile, your skin needs to be as free of germs as possible. You can reduce the number of germs on your skin by washing with CHG (chlorahexidine gluconate) Soap before surgery.  CHG is an antiseptic cleaner which kills germs and bonds with the skin to continue killing germs even after washing.    Oral Hygiene is also important to reduce your risk of infection.  Remember - BRUSH YOUR TEETH THE MORNING OF SURGERY WITH YOUR REGULAR TOOTHPASTE  Please do not use if you have an allergy to CHG or antibacterial soaps. If your skin becomes reddened/irritated stop using the CHG.  Do not shave (including legs and underarms) for at least 48 hours prior to first CHG shower. It is  OK to shave your face.  Please follow these instructions carefully.   1. Shower the NIGHT BEFORE SURGERY and the MORNING OF SURGERY with CHG.   2. If you chose to wash your hair, wash your hair first as usual with your normal shampoo.  3. After you shampoo, rinse your hair and body thoroughly to remove the shampoo.  4. Use CHG as you would any other liquid soap. You can apply CHG directly to the skin and wash gently with a scrungie or a clean washcloth.   5. Apply the CHG Soap to your body ONLY FROM THE NECK DOWN.  Do not use on open wounds or open sores. Avoid contact with your eyes, ears, mouth and genitals (private parts). Wash Face and genitals (private parts)  with your normal soap.  6. Wash thoroughly, paying special attention to the area where your surgery will be performed.  7. Thoroughly rinse your body with warm water from the neck down.  8. DO NOT shower/wash with your normal soap after using and rinsing off the CHG Soap.  9. Pat yourself dry with a CLEAN TOWEL.  10. Wear CLEAN PAJAMAS to bed the night before surgery, wear comfortable clothes the morning of surgery  11. Place CLEAN SHEETS on your bed the night of your first shower and DO NOT SLEEP WITH PETS.  Day of Surgery:  Do not apply any deodorants/lotions.  Please wear clean clothes to the hospital/surgery center.   Remember to brush your teeth WITH YOUR REGULAR TOOTHPASTE.    Please read over the following fact sheets that you were given. Coughing and Deep Breathing, MRSA Information and Surgical Site Infection Prevention

## 2018-04-30 ENCOUNTER — Encounter (HOSPITAL_COMMUNITY): Payer: Self-pay

## 2018-04-30 ENCOUNTER — Other Ambulatory Visit: Payer: Self-pay

## 2018-04-30 ENCOUNTER — Encounter (HOSPITAL_COMMUNITY)
Admission: RE | Admit: 2018-04-30 | Discharge: 2018-04-30 | Disposition: A | Discharge status: Home / Self Care | Payer: Self-pay | Source: Ambulatory Visit | Attending: Thoracic Surgery (Cardiothoracic Vascular Surgery) | Admitting: Thoracic Surgery (Cardiothoracic Vascular Surgery)

## 2018-04-30 ENCOUNTER — Ambulatory Visit (INDEPENDENT_AMBULATORY_CARE_PROVIDER_SITE_OTHER): Payer: Self-pay | Admitting: Thoracic Surgery (Cardiothoracic Vascular Surgery)

## 2018-04-30 ENCOUNTER — Encounter: Payer: Self-pay | Admitting: Thoracic Surgery (Cardiothoracic Vascular Surgery)

## 2018-04-30 ENCOUNTER — Ambulatory Visit (HOSPITAL_BASED_OUTPATIENT_CLINIC_OR_DEPARTMENT_OTHER)
Admission: RE | Admit: 2018-04-30 | Discharge: 2018-04-30 | Disposition: A | Discharge status: Home / Self Care | Payer: Self-pay | Source: Ambulatory Visit | Attending: Thoracic Surgery (Cardiothoracic Vascular Surgery) | Admitting: Thoracic Surgery (Cardiothoracic Vascular Surgery)

## 2018-04-30 ENCOUNTER — Ambulatory Visit (HOSPITAL_COMMUNITY)
Admission: RE | Admit: 2018-04-30 | Discharge: 2018-04-30 | Disposition: A | Discharge status: Home / Self Care | Payer: Self-pay | Source: Ambulatory Visit | Attending: Thoracic Surgery (Cardiothoracic Vascular Surgery) | Admitting: Thoracic Surgery (Cardiothoracic Vascular Surgery)

## 2018-04-30 VITALS — BP 120/84 | HR 80 | Resp 16 | Ht 71.0 in | Wt 181.0 lb

## 2018-04-30 DIAGNOSIS — I2510 Atherosclerotic heart disease of native coronary artery without angina pectoris: Secondary | ICD-10-CM

## 2018-04-30 DIAGNOSIS — Q245 Malformation of coronary vessels: Secondary | ICD-10-CM

## 2018-04-30 HISTORY — DX: Dyspnea, unspecified: R06.00

## 2018-04-30 LAB — TYPE AND SCREEN
ABO/RH(D): O POS
Antibody Screen: NEGATIVE

## 2018-04-30 LAB — PULMONARY FUNCTION TEST
DL/VA % pred: 94 %
DL/VA: 4.5 ml/min/mmHg/L
DLCO COR: 25.29 ml/min/mmHg
DLCO UNC: 25.22 ml/min/mmHg
DLCO cor % pred: 75 %
DLCO unc % pred: 74 %
FEF 25-75 Pre: 3.88 L/sec
FEF2575-%Pred-Pre: 89 %
FEV1-%Pred-Pre: 97 %
FEV1-PRE: 3.87 L
FEV1FVC-%Pred-Pre: 104 %
FEV6-%Pred-Pre: 93 %
FEV6-Pre: 4.41 L
FEV6FVC-%Pred-Pre: 101 %
FVC-%PRED-PRE: 92 %
FVC-PRE: 4.41 L
PRE FEV1/FVC RATIO: 88 %
PRE FEV6/FVC RATIO: 100 %
RV % pred: 112 %
RV: 1.83 L
TLC % PRED: 85 %
TLC: 6.06 L

## 2018-04-30 LAB — CBC
HCT: 44.4 % (ref 39.0–52.0)
Hemoglobin: 14.9 g/dL (ref 13.0–17.0)
MCH: 30.9 pg (ref 26.0–34.0)
MCHC: 33.6 g/dL (ref 30.0–36.0)
MCV: 92.1 fL (ref 80.0–100.0)
Platelets: 240 K/uL (ref 150–400)
RBC: 4.82 MIL/uL (ref 4.22–5.81)
RDW: 11.9 % (ref 11.5–15.5)
WBC: 8.5 K/uL (ref 4.0–10.5)
nRBC: 0 % (ref 0.0–0.2)

## 2018-04-30 LAB — HEMOGLOBIN A1C
HEMOGLOBIN A1C: 4.4 % — AB (ref 4.8–5.6)
Mean Plasma Glucose: 79.58 mg/dL

## 2018-04-30 LAB — BLOOD GAS, ARTERIAL
Acid-base deficit: 1.7 mmol/L (ref 0.0–2.0)
Bicarbonate: 22 mmol/L (ref 20.0–28.0)
Drawn by: 211791
O2 Saturation: 98.6 %
Patient temperature: 98.6
pCO2 arterial: 33.4 mmHg (ref 32.0–48.0)
pH, Arterial: 7.433 (ref 7.350–7.450)
pO2, Arterial: 112 mmHg — ABNORMAL HIGH (ref 83.0–108.0)

## 2018-04-30 LAB — COMPREHENSIVE METABOLIC PANEL
ALBUMIN: 4.4 g/dL (ref 3.5–5.0)
ALK PHOS: 112 U/L (ref 38–126)
ALT: 16 U/L (ref 0–44)
ANION GAP: 7 (ref 5–15)
AST: 17 U/L (ref 15–41)
BUN: 9 mg/dL (ref 6–20)
CO2: 24 mmol/L (ref 22–32)
Calcium: 9.2 mg/dL (ref 8.9–10.3)
Chloride: 105 mmol/L (ref 98–111)
Creatinine, Ser: 1.03 mg/dL (ref 0.61–1.24)
GFR calc Af Amer: >60 mL/min (ref >60)
GFR calc non Af Amer: >60 mL/min (ref >60)
Glucose, Bld: 87 mg/dL (ref 70–99)
Potassium: 3.9 mmol/L (ref 3.5–5.1)
SODIUM: 136 mmol/L (ref 135–145)
TOTAL PROTEIN: 7.6 g/dL (ref 6.5–8.1)
Total Bilirubin: 0.9 mg/dL (ref 0.3–1.2)

## 2018-04-30 LAB — URINALYSIS, ROUTINE W REFLEX MICROSCOPIC
Bilirubin Urine: NEGATIVE
Glucose, UA: NEGATIVE mg/dL
Hgb urine dipstick: NEGATIVE
Ketones, ur: NEGATIVE mg/dL
LEUKOCYTES UA: NEGATIVE
Nitrite: NEGATIVE
PH: 5 (ref 5.0–8.0)
PROTEIN: NEGATIVE mg/dL
Specific Gravity, Urine: 1.014 (ref 1.005–1.030)

## 2018-04-30 LAB — SURGICAL PCR SCREEN
MRSA, PCR: NEGATIVE
Staphylococcus aureus: NEGATIVE

## 2018-04-30 LAB — PROTIME-INR
INR: 1.08
Prothrombin Time: 13.9 s (ref 11.4–15.2)

## 2018-04-30 LAB — APTT: APTT: 33 s (ref 24–36)

## 2018-04-30 LAB — ABO/RH: ABO/RH(D): O POS

## 2018-04-30 MED ORDER — ALBUTEROL SULFATE (2.5 MG/3ML) 0.083% IN NEBU: 2.5000 mg | INHALATION_SOLUTION | Freq: Once | RESPIRATORY_TRACT | Status: DC

## 2018-04-30 NOTE — Pre-Procedure Instructions (Signed)
Jerry Harper  04/30/2018      [facility] DRUG STORE #[id number] [facility], [place] - [phone] AT [facility] [address] ([address] [place] Mesi [place] [address] Phone: [phone] Fax: [phone]    Your procedure is scheduled on Nov. 15.  Report to [facility] Admitting at 5:30  A.M.  Call this number if you have problems the morning of surgery:  [phone]   Remember:  Do not eat or drink after midnight.      Take these medicines the morning of surgery with A SIP OF WATER : NONE  7 days prior to surgery STOP taking any Aspirin(unless otherwise instructed by your surgeon), Aleve, Naproxen, Ibuprofen, Motrin, Advil, Goody's, BC's, all herbal medications, fish oil, and all vitamins    Do not wear jewelry.  Do not wear lotions, powders, or perfumes, or deodorant.  Do not shave 48 hours prior to surgery.  Men may shave face and neck.  Do not bring valuables to the hospital.  [facility] is not responsible for any belongings or valuables.  Contacts, dentures or bridgework may not be worn into surgery.  Leave your suitcase in the car.  After surgery it may be brought to your room.  For patients admitted to the hospital, discharge time will be determined by your treatment team.  Patients discharged the day of surgery will not be allowed to drive home.    Special instructions:   [place]- Preparing For Surgery  Before surgery, you can play an important role. Because skin is not sterile, your skin needs to be as free of germs as possible. You can reduce the number of germs on your skin by washing with CHG (chlorahexidine gluconate) Soap before surgery.  CHG is an antiseptic cleaner which kills germs and bonds with the skin to continue killing germs even after washing.    Oral Hygiene is also important to reduce your risk of infection.  Remember - BRUSH YOUR TEETH THE MORNING OF SURGERY WITH YOUR REGULAR TOOTHPASTE  Please do not use if  you have an allergy to CHG or antibacterial soaps. If your skin becomes reddened/irritated stop using the CHG.  Do not shave (including legs and underarms) for at least 48 hours prior to first CHG shower. It is OK to shave your face.  Please follow these instructions carefully.   1. Shower the NIGHT BEFORE SURGERY and the MORNING OF SURGERY with CHG.   2. If you chose to wash your hair, wash your hair first as usual with your normal shampoo.  3. After you shampoo, rinse your hair and body thoroughly to remove the shampoo.  4. Use CHG as you would any other liquid soap. You can apply CHG directly to the skin and wash gently with a scrungie or a clean washcloth.   5. Apply the CHG Soap to your body ONLY FROM THE NECK DOWN.  Do not use on open wounds or open sores. Avoid contact with your eyes, ears, mouth and genitals (private parts). Wash Face and genitals (private parts)  with your normal soap.  6. Wash thoroughly, paying special attention to the area where your surgery will be performed.  7. Thoroughly rinse your body with warm water from the neck down.  8. DO NOT shower/wash with your normal soap after using and rinsing off the CHG Soap.  9. Pat yourself dry with a CLEAN TOWEL.  10. Wear CLEAN PAJAMAS to bed  the night before surgery, wear comfortable clothes the morning of surgery  11. Place CLEAN SHEETS on your bed the night of your first shower and DO NOT SLEEP WITH PETS.    Day of Surgery:  Do not apply any deodorants/lotions.  Please wear clean clothes to the hospital/surgery center.   Remember to brush your teeth WITH YOUR REGULAR TOOTHPASTE.    Please read over the following fact sheets that you were given. Coughing and Deep Breathing, MRSA Information and Surgical Site Infection Prevention

## 2018-04-30 NOTE — Patient Instructions (Signed)
   Have nothing to eat or drink after midnight the night before surgery.

## 2018-04-30 NOTE — Progress Notes (Signed)
Pre-CABG testing has been completed. 1-39% ICA stenosis bilaterally.  Palmar waveforms Right Palmar waveforms are obliterated with radial compression and remain within normal limits with ulnar compression. Left Palmar waveforms remain within normal limits with radial compression and are obliterated with ulnar compression.  ABI's Right 1.13 Left 1.19  04/30/18 10:04 AM Olen Cordial RVT

## 2018-04-30 NOTE — Progress Notes (Signed)
PCP - denies Cardiologist -Dr. Eden Emms   Chest x-ray - 04/30/18 EKG - 04/30/18 Stress Test - 03/21/18 ECHO - 06/24/2015 Cardiac Cath - denies  Sleep Study - denies  Aspirin Instructions:N/A  Anesthesia review: Yes, hx of CAD  Patient denies shortness of breath, fever, cough and chest pain at PAT appointment   Patient verbalized understanding of instructions that were given to them at the PAT appointment. Patient was also instructed that they will need to review over the PAT instructions again at home before surgery.

## 2018-04-30 NOTE — Progress Notes (Signed)
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  He was originally seen in consultation on April 05, 2018.  He reports no new problems or complaints.  He has not had any further syncopal episodes.   No current outpatient medications on file.   No current facility-administered medications for this visit.       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  The vessel courses intra-arterial at an acute angle between the pulmonary artery and the aorta.  He has right dominant coronary circulation.  Left coronary circulation is entirely normal.  There is no calcification and no other sign of atherosclerotic disease.  Patient has recurrent symptoms of exertional chest pain and shortness of breath as well as 2 frank syncopal episodes.  Options include continued observation versus elective surgical intervention.  Surgical options include conventional surgical revascularization using coronary artery bypass grafting versus "unroofing" or reimplantation of the  proximal segment of the right coronary artery through the aortic root.     Plan:  I have personally reviewed the patient's cardiac gated CT angiogram and discuss findings at length with the patient and his wife in the office today.  I have previously reviewed his case with several of my partners.  We plan coronary artery bypass grafting using right internal mammary artery graft to the right coronary artery in the operating room later this week.  The patient understands and accepts all potential associated risks of surgery including but not limited to risk of death, stroke or other neurologic complication, myocardial infarction, congestive heart failure, respiratory failure, renal failure, bleeding requiring blood transfusion and/or reexploration, aortic dissection or other major vascular complication, arrhythmia, heart block or bradycardia requiring permanent pacemaker, pneumonia, pleural effusion, wound infection, pulmonary embolus or other thromboembolic complication, chronic pain or other delayed complications related to median sternotomy, or the late recurrence of symptomatic ischemic heart disease and/or congestive heart failure.  The possibility of late atresia of the bypass graft caused by competitive blood flow has been discussed.  Expectations for the patient's postoperative convalescence have been discussed..  All questions answered.     I spent in excess of 15 minutes during the conduct of this office consultation and >50% of this time involved direct face-to-face encounter with the patient for counseling and/or coordination of their care.    Salvatore Decent. Cornelius Moras, MD 04/30/2018 12:38 PM

## 2018-05-03 ENCOUNTER — Encounter (HOSPITAL_COMMUNITY): Payer: Self-pay | Admitting: Certified Registered Nurse Anesthetist

## 2018-05-03 MED ORDER — TRANEXAMIC ACID (OHS) PUMP PRIME SOLUTION
2.0000 mg/kg | INTRAVENOUS | Status: DC
  Filled 2018-05-03: qty 1.64

## 2018-05-03 MED ORDER — PLASMA-LYTE 148 IV SOLN
INTRAVENOUS | Status: DC
  Filled 2018-05-03: qty 2.5

## 2018-05-03 MED ORDER — INSULIN REGULAR(HUMAN) IN NACL 100-0.9 UT/100ML-% IV SOLN
INTRAVENOUS | Status: AC
  Administered 2018-05-04: .7 [IU]/h via INTRAVENOUS
  Filled 2018-05-03: qty 100

## 2018-05-03 MED ORDER — POTASSIUM CHLORIDE 2 MEQ/ML IV SOLN
80.0000 meq | INTRAVENOUS | Status: DC
  Filled 2018-05-03: qty 40

## 2018-05-03 MED ORDER — VANCOMYCIN HCL 10 G IV SOLR
1250.0000 mg | INTRAVENOUS | Status: AC
  Administered 2018-05-04: 1250 mg via INTRAVENOUS
  Filled 2018-05-03: qty 1250

## 2018-05-03 MED ORDER — DEXMEDETOMIDINE HCL IN NACL 400 MCG/100ML IV SOLN
0.1000 ug/kg/h | INTRAVENOUS | Status: AC
  Administered 2018-05-04: 0.7 ug/kg/h via INTRAVENOUS
  Filled 2018-05-03: qty 100

## 2018-05-03 MED ORDER — TRANEXAMIC ACID (OHS) BOLUS VIA INFUSION
15.0000 mg/kg | INTRAVENOUS | Status: AC
  Administered 2018-05-04: 1231.5 mg via INTRAVENOUS
  Filled 2018-05-03: qty 1232

## 2018-05-03 MED ORDER — SODIUM CHLORIDE 0.9 % IV SOLN
1.5000 g | INTRAVENOUS | Status: AC
  Administered 2018-05-04: .75 g via INTRAVENOUS
  Administered 2018-05-04: 1.5 g via INTRAVENOUS
  Filled 2018-05-03: qty 1.5

## 2018-05-03 MED ORDER — MAGNESIUM SULFATE 50 % IJ SOLN
40.0000 meq | INTRAMUSCULAR | Status: DC
  Filled 2018-05-03: qty 9.85

## 2018-05-03 MED ORDER — SODIUM CHLORIDE 0.9 % IV SOLN
INTRAVENOUS | Status: DC
  Filled 2018-05-03: qty 30

## 2018-05-03 MED ORDER — VANCOMYCIN HCL 1000 MG IV SOLR
INTRAVENOUS | Status: DC
  Filled 2018-05-03: qty 1000

## 2018-05-03 MED ORDER — MILRINONE LACTATE IN DEXTROSE 20-5 MG/100ML-% IV SOLN
0.3000 ug/kg/min | INTRAVENOUS | Status: DC
  Filled 2018-05-03: qty 100

## 2018-05-03 MED ORDER — EPINEPHRINE PF 1 MG/ML IJ SOLN
0.0000 ug/min | INTRAVENOUS | Status: DC
  Filled 2018-05-03: qty 4

## 2018-05-03 MED ORDER — NITROGLYCERIN IN D5W 200-5 MCG/ML-% IV SOLN
2.0000 ug/min | INTRAVENOUS | Status: DC
  Filled 2018-05-03: qty 250

## 2018-05-03 MED ORDER — DOPAMINE-DEXTROSE 3.2-5 MG/ML-% IV SOLN
0.0000 ug/kg/min | INTRAVENOUS | Status: DC
  Filled 2018-05-03: qty 250

## 2018-05-03 MED ORDER — TRANEXAMIC ACID 1000 MG/10ML IV SOLN
1.5000 mg/kg/h | INTRAVENOUS | Status: AC
  Administered 2018-05-04: 1.5 mg/kg/h via INTRAVENOUS
  Filled 2018-05-03: qty 25

## 2018-05-03 MED ORDER — PHENYLEPHRINE HCL-NACL 20-0.9 MG/250ML-% IV SOLN
30.0000 ug/min | INTRAVENOUS | Status: AC
  Administered 2018-05-04: 15 ug/min via INTRAVENOUS
  Filled 2018-05-03: qty 250

## 2018-05-03 MED ORDER — SODIUM CHLORIDE 0.9 % IV SOLN
750.0000 mg | INTRAVENOUS | Status: DC
  Filled 2018-05-03: qty 750

## 2018-05-03 NOTE — Anesthesia Preprocedure Evaluation (Addendum)
Anesthesia Evaluation  Patient identified by MRN, date of birth, ID band Patient awake    Reviewed: Allergy & Precautions, NPO status , Patient's Chart, lab work & pertinent test results  History of Anesthesia Complications Negative for: history of anesthetic complications  Airway Mallampati: I  TM Distance: >3 FB Neck ROM: Full    Dental  (+) Dental Advisory Given, Teeth Intact   Pulmonary shortness of breath and with exertion,    breath sounds clear to auscultation       Cardiovascular hypertension,  Rhythm:Regular Rate:Normal   Anomalous RCA causing anginal symptoms  '19 Carotid US - 1-39% b/l ICAS  '19 Cardiac CTA - IMPRESSION: 1. Calcium score 0 2.  Normal aortic root 2.9 cm 3. Anomalous RCA origin from left cusp abutting or common ostium to LM Course does run between the MPA and aortic root suggesting possible source of angina/chest pain  '17 TTE - EF 55% to 60%. No pathology noted.     Neuro/Psych negative neurological ROS  negative psych ROS   GI/Hepatic negative GI ROS, Neg liver ROS,   Endo/Other  negative endocrine ROS  Renal/GU negative Renal ROS     Musculoskeletal negative musculoskeletal ROS (+)   Abdominal   Peds  Hematology negative hematology ROS (+)   Anesthesia Other Findings   Reproductive/Obstetrics                           Anesthesia Physical Anesthesia Plan  ASA: III  Anesthesia Plan: General   Post-op Pain Management:    Induction: Intravenous  PONV Risk Score and Plan: 2 and Treatment may vary due to age or medical condition, Ondansetron, Dexamethasone and Midazolam  Airway Management Planned: Oral ETT  Additional Equipment: Arterial line, CVP, PA Cath, TEE and Ultrasound Guidance Line Placement  Intra-op Plan:   Post-operative Plan: Possible Post-op intubation/ventilation  Informed Consent: I have reviewed the patients History and  Physical, chart, labs and discussed the procedure including the risks, benefits and alternatives for the proposed anesthesia with the patient or authorized representative who has indicated his/her understanding and acceptance.   Dental advisory given  Plan Discussed with: CRNA and Anesthesiologist  Anesthesia Plan Comments:        Anesthesia Quick Evaluation

## 2018-05-04 ENCOUNTER — Inpatient Hospital Stay (HOSPITAL_COMMUNITY): Payer: Self-pay

## 2018-05-04 ENCOUNTER — Inpatient Hospital Stay (HOSPITAL_COMMUNITY): Payer: Self-pay | Admitting: Certified Registered Nurse Anesthetist

## 2018-05-04 ENCOUNTER — Encounter (HOSPITAL_COMMUNITY): Payer: Self-pay | Admitting: *Deleted

## 2018-05-04 ENCOUNTER — Inpatient Hospital Stay (HOSPITAL_COMMUNITY)
Admission: RE | Disposition: A | Discharge status: Home / Self Care | Payer: Self-pay | Source: Ambulatory Visit | Attending: Thoracic Surgery (Cardiothoracic Vascular Surgery)

## 2018-05-04 ENCOUNTER — Inpatient Hospital Stay (HOSPITAL_COMMUNITY): Payer: Self-pay | Admitting: Physician Assistant

## 2018-05-04 ENCOUNTER — Inpatient Hospital Stay (HOSPITAL_COMMUNITY)
Admission: RE | Admit: 2018-05-04 | Discharge: 2018-05-08 | DRG: 236 | Disposition: A | Discharge status: Home / Self Care | Payer: Self-pay | Source: Ambulatory Visit | Attending: Thoracic Surgery (Cardiothoracic Vascular Surgery) | Admitting: Thoracic Surgery (Cardiothoracic Vascular Surgery)

## 2018-05-04 DIAGNOSIS — I10 Essential (primary) hypertension: Secondary | ICD-10-CM | POA: Diagnosis present

## 2018-05-04 DIAGNOSIS — J9811 Atelectasis: Secondary | ICD-10-CM | POA: Diagnosis not present

## 2018-05-04 DIAGNOSIS — Z951 Presence of aortocoronary bypass graft: Secondary | ICD-10-CM

## 2018-05-04 DIAGNOSIS — Z91010 Allergy to peanuts: Secondary | ICD-10-CM

## 2018-05-04 DIAGNOSIS — I2510 Atherosclerotic heart disease of native coronary artery without angina pectoris: Secondary | ICD-10-CM

## 2018-05-04 DIAGNOSIS — Z832 Family history of diseases of the blood and blood-forming organs and certain disorders involving the immune mechanism: Secondary | ICD-10-CM

## 2018-05-04 DIAGNOSIS — D62 Acute posthemorrhagic anemia: Secondary | ICD-10-CM | POA: Diagnosis not present

## 2018-05-04 DIAGNOSIS — Q245 Malformation of coronary vessels: Principal | ICD-10-CM

## 2018-05-04 DIAGNOSIS — R6510 Systemic inflammatory response syndrome (SIRS) of non-infectious origin without acute organ dysfunction: Secondary | ICD-10-CM | POA: Diagnosis not present

## 2018-05-04 DIAGNOSIS — Z09 Encounter for follow-up examination after completed treatment for conditions other than malignant neoplasm: Secondary | ICD-10-CM

## 2018-05-04 HISTORY — PX: CORONARY ARTERY BYPASS GRAFT: SHX141

## 2018-05-04 HISTORY — PX: TEE WITHOUT CARDIOVERSION: SHX5443

## 2018-05-04 LAB — POCT I-STAT, CHEM 8
BUN: 12 mg/dL (ref 6–20)
BUN: 12 mg/dL (ref 6–20)
BUN: 10 mg/dL (ref 6–20)
BUN: 10 mg/dL (ref 6–20)
BUN: 11 mg/dL (ref 6–20)
CALCIUM ION: 1.06 mmol/L — AB (ref 1.15–1.40)
CHLORIDE: 105 mmol/L (ref 98–111)
CHLORIDE: 105 mmol/L (ref 98–111)
CREATININE: 0.7 mg/dL (ref 0.61–1.24)
CREATININE: 0.6 mg/dL — AB (ref 0.61–1.24)
CREATININE: 0.7 mg/dL (ref 0.61–1.24)
CREATININE: 0.7 mg/dL (ref 0.61–1.24)
Calcium, Ion: 1.24 mmol/L (ref 1.15–1.40)
Calcium, Ion: 1.2 mmol/L (ref 1.15–1.40)
Calcium, Ion: 1.03 mmol/L — ABNORMAL LOW (ref 1.15–1.40)
Calcium, Ion: 1.07 mmol/L — ABNORMAL LOW (ref 1.15–1.40)
Chloride: 105 mmol/L (ref 98–111)
Chloride: 104 mmol/L (ref 98–111)
Chloride: 104 mmol/L (ref 98–111)
Creatinine, Ser: 0.7 mg/dL (ref 0.61–1.24)
GLUCOSE: 142 mg/dL — AB (ref 70–99)
GLUCOSE: 143 mg/dL — AB (ref 70–99)
Glucose, Bld: 94 mg/dL (ref 70–99)
Glucose, Bld: 144 mg/dL — ABNORMAL HIGH (ref 70–99)
Glucose, Bld: 145 mg/dL — ABNORMAL HIGH (ref 70–99)
HCT: 23 % — ABNORMAL LOW (ref 39.0–52.0)
HCT: 26 % — ABNORMAL LOW (ref 39.0–52.0)
HEMATOCRIT: 39 % (ref 39.0–52.0)
HEMATOCRIT: 34 % — AB (ref 39.0–52.0)
HEMATOCRIT: 25 % — AB (ref 39.0–52.0)
HEMOGLOBIN: 7.8 g/dL — AB (ref 13.0–17.0)
HEMOGLOBIN: 8.5 g/dL — AB (ref 13.0–17.0)
Hemoglobin: 13.3 g/dL (ref 13.0–17.0)
Hemoglobin: 11.6 g/dL — ABNORMAL LOW (ref 13.0–17.0)
Hemoglobin: 8.8 g/dL — ABNORMAL LOW (ref 13.0–17.0)
POTASSIUM: 3.7 mmol/L (ref 3.5–5.1)
POTASSIUM: 4 mmol/L (ref 3.5–5.1)
POTASSIUM: 5.2 mmol/L — AB (ref 3.5–5.1)
Potassium: 4.6 mmol/L (ref 3.5–5.1)
Potassium: 4.6 mmol/L (ref 3.5–5.1)
SODIUM: 139 mmol/L (ref 135–145)
Sodium: 140 mmol/L (ref 135–145)
Sodium: 137 mmol/L (ref 135–145)
Sodium: 138 mmol/L (ref 135–145)
Sodium: 138 mmol/L (ref 135–145)
TCO2: 28 mmol/L (ref 22–32)
TCO2: 28 mmol/L (ref 22–32)
TCO2: 26 mmol/L (ref 22–32)
TCO2: 26 mmol/L (ref 22–32)
TCO2: 27 mmol/L (ref 22–32)

## 2018-05-04 LAB — POCT I-STAT 3, ART BLOOD GAS (G3+)
Acid-base deficit: 1 mmol/L (ref 0.0–2.0)
Acid-base deficit: 2 mmol/L (ref 0.0–2.0)
BICARBONATE: 23.5 mmol/L (ref 20.0–28.0)
Bicarbonate: 24.8 mmol/L (ref 20.0–28.0)
O2 Saturation: 100 %
O2 Saturation: 100 %
PCO2 ART: 49 mmHg — AB (ref 32.0–48.0)
PH ART: 7.399 (ref 7.350–7.450)
PH ART: 7.309 — AB (ref 7.350–7.450)
PO2 ART: 321 mmHg — AB (ref 83.0–108.0)
PO2 ART: 298 mmHg — AB (ref 83.0–108.0)
Patient temperature: 36.3
TCO2: 25 mmol/L (ref 22–32)
TCO2: 26 mmol/L (ref 22–32)
pCO2 arterial: 38 mmHg (ref 32.0–48.0)

## 2018-05-04 LAB — CBC WITH DIFFERENTIAL/PLATELET
Abs Immature Granulocytes: 0.07 10*3/uL (ref 0.00–0.07)
BASOS ABS: 0 10*3/uL (ref 0.0–0.1)
Basophils Relative: 0 %
Eosinophils Absolute: 0 10*3/uL (ref 0.0–0.5)
Eosinophils Relative: 0 %
HEMATOCRIT: 32.8 % — AB (ref 39.0–52.0)
HEMOGLOBIN: 11.3 g/dL — AB (ref 13.0–17.0)
IMMATURE GRANULOCYTES: 1 %
LYMPHS ABS: 0.3 10*3/uL — AB (ref 0.7–4.0)
LYMPHS PCT: 3 %
MCH: 31.2 pg (ref 26.0–34.0)
MCHC: 34.5 g/dL (ref 30.0–36.0)
MCV: 90.6 fL (ref 80.0–100.0)
Monocytes Absolute: 0.5 10*3/uL (ref 0.1–1.0)
Monocytes Relative: 5 %
NEUTROS PCT: 91 %
Neutro Abs: 10.2 10*3/uL — ABNORMAL HIGH (ref 1.7–7.7)
Platelets: 154 10*3/uL (ref 150–400)
RBC: 3.62 MIL/uL — AB (ref 4.22–5.81)
RDW: 11.7 % (ref 11.5–15.5)
WBC: 11.1 10*3/uL — AB (ref 4.0–10.5)
nRBC: 0 % (ref 0.0–0.2)

## 2018-05-04 LAB — GLUCOSE, CAPILLARY
GLUCOSE-CAPILLARY: 123 mg/dL — AB (ref 70–99)
GLUCOSE-CAPILLARY: 109 mg/dL — AB (ref 70–99)
GLUCOSE-CAPILLARY: 109 mg/dL — AB (ref 70–99)
GLUCOSE-CAPILLARY: 119 mg/dL — AB (ref 70–99)
GLUCOSE-CAPILLARY: 109 mg/dL — AB (ref 70–99)
Glucose-Capillary: 97 mg/dL (ref 70–99)
Glucose-Capillary: 99 mg/dL (ref 70–99)
Glucose-Capillary: 114 mg/dL — ABNORMAL HIGH (ref 70–99)
Glucose-Capillary: 97 mg/dL (ref 70–99)
Glucose-Capillary: 138 mg/dL — ABNORMAL HIGH (ref 70–99)

## 2018-05-04 LAB — CREATININE, SERUM
CREATININE: 0.93 mg/dL (ref 0.61–1.24)
GFR calc Af Amer: >60 mL/min (ref >60)

## 2018-05-04 LAB — PROTIME-INR
INR: 1.33
Prothrombin Time: 16.4 seconds — ABNORMAL HIGH (ref 11.4–15.2)

## 2018-05-04 LAB — POCT I-STAT 4, (NA,K, GLUC, HGB,HCT)
GLUCOSE: 110 mg/dL — AB (ref 70–99)
HEMATOCRIT: 34 % — AB (ref 39.0–52.0)
Hemoglobin: 11.6 g/dL — ABNORMAL LOW (ref 13.0–17.0)
Potassium: 3.6 mmol/L (ref 3.5–5.1)
Sodium: 141 mmol/L (ref 135–145)

## 2018-05-04 LAB — CBC
HCT: 36.3 % — ABNORMAL LOW (ref 39.0–52.0)
HEMOGLOBIN: 12.2 g/dL — AB (ref 13.0–17.0)
MCH: 30.7 pg (ref 26.0–34.0)
MCHC: 33.6 g/dL (ref 30.0–36.0)
MCV: 91.4 fL (ref 80.0–100.0)
Platelets: 202 10*3/uL (ref 150–400)
RBC: 3.97 MIL/uL — ABNORMAL LOW (ref 4.22–5.81)
RDW: 11.8 % (ref 11.5–15.5)
WBC: 18.8 10*3/uL — AB (ref 4.0–10.5)
nRBC: 0 % (ref 0.0–0.2)

## 2018-05-04 LAB — MAGNESIUM: MAGNESIUM: 2.5 mg/dL — AB (ref 1.7–2.4)

## 2018-05-04 LAB — HEMOGLOBIN AND HEMATOCRIT, BLOOD
HEMATOCRIT: 26.5 % — AB (ref 39.0–52.0)
Hemoglobin: 8.8 g/dL — ABNORMAL LOW (ref 13.0–17.0)

## 2018-05-04 LAB — APTT: aPTT: 33 seconds (ref 24–36)

## 2018-05-04 LAB — PLATELET COUNT: Platelets: 168 10*3/uL (ref 150–400)

## 2018-05-04 SURGERY — CORONARY ARTERY BYPASS GRAFTING (CABG)
Anesthesia: General | Site: Chest

## 2018-05-04 MED ORDER — METOPROLOL TARTRATE 12.5 MG HALF TABLET
12.5000 mg | ORAL_TABLET | Freq: Once | ORAL | Status: AC
  Administered 2018-05-04: 12.5 mg via ORAL
  Filled 2018-05-04: qty 1

## 2018-05-04 MED ORDER — KETOROLAC TROMETHAMINE 30 MG/ML IJ SOLN
30.0000 mg | Freq: Once | INTRAMUSCULAR | Status: AC
  Administered 2018-05-04: 30 mg via INTRAVENOUS
  Filled 2018-05-04: qty 1

## 2018-05-04 MED ORDER — PROTAMINE SULFATE 10 MG/ML IV SOLN
INTRAVENOUS | Status: DC | PRN
  Administered 2018-05-04: 10 mg via INTRAVENOUS
  Administered 2018-05-04: 210 mg via INTRAVENOUS

## 2018-05-04 MED ORDER — ONDANSETRON HCL 4 MG/2ML IJ SOLN
INTRAMUSCULAR | Status: AC
  Filled 2018-05-04: qty 2

## 2018-05-04 MED ORDER — SODIUM CHLORIDE 0.9 % IV SOLN: INTRAVENOUS | Status: AC

## 2018-05-04 MED ORDER — PLASMA-LYTE 148 IV SOLN
INTRAVENOUS | Status: DC | PRN
  Administered 2018-05-04: 500 mL

## 2018-05-04 MED ORDER — PROTAMINE SULFATE 10 MG/ML IV SOLN
INTRAVENOUS | Status: AC
  Filled 2018-05-04: qty 25

## 2018-05-04 MED ORDER — HEMOSTATIC AGENTS (NO CHARGE) OPTIME
TOPICAL | Status: DC | PRN
  Administered 2018-05-04: 2 via TOPICAL

## 2018-05-04 MED ORDER — ONDANSETRON HCL 4 MG/2ML IJ SOLN: 4.0000 mg | Freq: Four times a day (QID) | INTRAMUSCULAR | Status: DC | PRN

## 2018-05-04 MED ORDER — LACTATED RINGERS IV SOLN: INTRAVENOUS | Status: DC

## 2018-05-04 MED ORDER — METOPROLOL TARTRATE 12.5 MG HALF TABLET
12.5000 mg | ORAL_TABLET | Freq: Two times a day (BID) | ORAL | Status: DC
  Administered 2018-05-05 – 2018-05-06 (×3): 12.5 mg via ORAL
  Filled 2018-05-04 (×4): qty 1

## 2018-05-04 MED ORDER — ONDANSETRON HCL 4 MG/2ML IJ SOLN
INTRAMUSCULAR | Status: DC | PRN
  Administered 2018-05-04: 4 mg via INTRAVENOUS

## 2018-05-04 MED ORDER — ACETAMINOPHEN 160 MG/5ML PO SOLN: 650.0000 mg | Freq: Once | ORAL | Status: AC

## 2018-05-04 MED ORDER — LACTATED RINGERS IV SOLN
INTRAVENOUS | Status: DC | PRN
  Administered 2018-05-04: 07:00:00 via INTRAVENOUS

## 2018-05-04 MED ORDER — SODIUM CHLORIDE 0.45 % IV SOLN: INTRAVENOUS | Status: DC | PRN

## 2018-05-04 MED ORDER — FAMOTIDINE IN NACL 20-0.9 MG/50ML-% IV SOLN: 20.0000 mg | Freq: Two times a day (BID) | INTRAVENOUS | Status: AC

## 2018-05-04 MED ORDER — FENTANYL CITRATE (PF) 250 MCG/5ML IJ SOLN
INTRAMUSCULAR | Status: AC
  Filled 2018-05-04: qty 25

## 2018-05-04 MED ORDER — METOPROLOL TARTRATE 5 MG/5ML IV SOLN: 2.5000 mg | INTRAVENOUS | Status: DC | PRN

## 2018-05-04 MED ORDER — MIDAZOLAM HCL (PF) 10 MG/2ML IJ SOLN
INTRAMUSCULAR | Status: AC
  Filled 2018-05-04: qty 2

## 2018-05-04 MED ORDER — NITROGLYCERIN IN D5W 200-5 MCG/ML-% IV SOLN: 0.0000 ug/min | INTRAVENOUS | Status: DC

## 2018-05-04 MED ORDER — DEXMEDETOMIDINE HCL IN NACL 200 MCG/50ML IV SOLN
0.0000 ug/kg/h | INTRAVENOUS | Status: DC
  Administered 2018-05-04: 0.2 ug/kg/h via INTRAVENOUS

## 2018-05-04 MED ORDER — PANTOPRAZOLE SODIUM 40 MG PO TBEC
40.0000 mg | DELAYED_RELEASE_TABLET | Freq: Every day | ORAL | Status: DC
  Administered 2018-05-06: 40 mg via ORAL
  Filled 2018-05-04: qty 1

## 2018-05-04 MED ORDER — MORPHINE SULFATE (PF) 2 MG/ML IV SOLN: 1.0000 mg | INTRAVENOUS | Status: AC | PRN

## 2018-05-04 MED ORDER — INSULIN REGULAR BOLUS VIA INFUSION
0.0000 [IU] | Freq: Three times a day (TID) | INTRAVENOUS | Status: DC
  Filled 2018-05-04: qty 10

## 2018-05-04 MED ORDER — PROPOFOL 10 MG/ML IV BOLUS
INTRAVENOUS | Status: AC
  Filled 2018-05-04: qty 20

## 2018-05-04 MED ORDER — POTASSIUM CHLORIDE 10 MEQ/50ML IV SOLN
10.0000 meq | INTRAVENOUS | Status: AC
  Administered 2018-05-04 (×3): 10 meq via INTRAVENOUS

## 2018-05-04 MED ORDER — ASPIRIN 81 MG PO CHEW: 324.0000 mg | CHEWABLE_TABLET | Freq: Every day | ORAL | Status: DC

## 2018-05-04 MED ORDER — ACETAMINOPHEN 500 MG PO TABS
1000.0000 mg | ORAL_TABLET | Freq: Four times a day (QID) | ORAL | Status: DC
  Administered 2018-05-05 – 2018-05-06 (×6): 1000 mg via ORAL
  Filled 2018-05-04 (×6): qty 2

## 2018-05-04 MED ORDER — LACTATED RINGERS IV SOLN: 500.0000 mL | Freq: Once | INTRAVENOUS | Status: DC | PRN

## 2018-05-04 MED ORDER — ALBUMIN HUMAN 5 % IV SOLN
250.0000 mL | INTRAVENOUS | Status: AC | PRN
  Administered 2018-05-04 (×3): 12.5 g via INTRAVENOUS
  Filled 2018-05-04 (×2): qty 250

## 2018-05-04 MED ORDER — DOCUSATE SODIUM 100 MG PO CAPS
200.0000 mg | ORAL_CAPSULE | Freq: Every day | ORAL | Status: DC
  Administered 2018-05-05 – 2018-05-06 (×2): 200 mg via ORAL
  Filled 2018-05-04 (×2): qty 2

## 2018-05-04 MED ORDER — FENTANYL CITRATE (PF) 250 MCG/5ML IJ SOLN
INTRAMUSCULAR | Status: DC | PRN
  Administered 2018-05-04: 50 ug via INTRAVENOUS
  Administered 2018-05-04: 100 ug via INTRAVENOUS
  Administered 2018-05-04 (×5): 50 ug via INTRAVENOUS

## 2018-05-04 MED ORDER — SODIUM CHLORIDE 0.9 % IV SOLN: 250.0000 mL | INTRAVENOUS | Status: DC

## 2018-05-04 MED ORDER — 0.9 % SODIUM CHLORIDE (POUR BTL) OPTIME
TOPICAL | Status: DC | PRN
  Administered 2018-05-04: 5000 mL

## 2018-05-04 MED ORDER — PHENYLEPHRINE HCL-NACL 20-0.9 MG/250ML-% IV SOLN
0.0000 ug/min | INTRAVENOUS | Status: DC
  Administered 2018-05-04: 25 ug/min via INTRAVENOUS

## 2018-05-04 MED ORDER — ACETAMINOPHEN 650 MG RE SUPP
650.0000 mg | Freq: Once | RECTAL | Status: AC
  Administered 2018-05-04: 650 mg via RECTAL

## 2018-05-04 MED ORDER — LIDOCAINE 2% (20 MG/ML) 5 ML SYRINGE
INTRAMUSCULAR | Status: AC
  Filled 2018-05-04: qty 5

## 2018-05-04 MED ORDER — DEXAMETHASONE SODIUM PHOSPHATE 10 MG/ML IJ SOLN
INTRAMUSCULAR | Status: AC
  Filled 2018-05-04: qty 1

## 2018-05-04 MED ORDER — CHLORHEXIDINE GLUCONATE 0.12 % MT SOLN
15.0000 mL | OROMUCOSAL | Status: AC
  Administered 2018-05-04: 15 mL via OROMUCOSAL

## 2018-05-04 MED ORDER — BISACODYL 5 MG PO TBEC
10.0000 mg | DELAYED_RELEASE_TABLET | Freq: Every day | ORAL | Status: DC
  Administered 2018-05-05 – 2018-05-06 (×2): 10 mg via ORAL
  Filled 2018-05-04 (×2): qty 2

## 2018-05-04 MED ORDER — HEPARIN SODIUM (PORCINE) 1000 UNIT/ML IJ SOLN
INTRAMUSCULAR | Status: DC | PRN
  Administered 2018-05-04: 25000 [IU] via INTRAVENOUS

## 2018-05-04 MED ORDER — MIDAZOLAM HCL 5 MG/5ML IJ SOLN
INTRAMUSCULAR | Status: DC | PRN
  Administered 2018-05-04: 4 mg via INTRAVENOUS
  Administered 2018-05-04: 2 mg via INTRAVENOUS

## 2018-05-04 MED ORDER — SUGAMMADEX SODIUM 200 MG/2ML IV SOLN
INTRAVENOUS | Status: DC | PRN
  Administered 2018-05-04: 200 mg via INTRAVENOUS

## 2018-05-04 MED ORDER — ACETAMINOPHEN 160 MG/5ML PO SOLN: 1000.0000 mg | Freq: Four times a day (QID) | ORAL | Status: DC

## 2018-05-04 MED ORDER — MAGNESIUM SULFATE 4 GM/100ML IV SOLN
4.0000 g | Freq: Once | INTRAVENOUS | Status: AC
  Administered 2018-05-04: 4 g via INTRAVENOUS
  Filled 2018-05-04: qty 100

## 2018-05-04 MED ORDER — MORPHINE SULFATE (PF) 2 MG/ML IV SOLN
1.0000 mg | INTRAVENOUS | Status: DC | PRN
  Administered 2018-05-04 – 2018-05-05 (×6): 2 mg via INTRAVENOUS
  Filled 2018-05-04 (×6): qty 1

## 2018-05-04 MED ORDER — SODIUM CHLORIDE 0.9 % IV SOLN
1.5000 g | Freq: Two times a day (BID) | INTRAVENOUS | Status: AC
  Administered 2018-05-04 – 2018-05-06 (×4): 1.5 g via INTRAVENOUS
  Filled 2018-05-04 (×4): qty 1.5

## 2018-05-04 MED ORDER — PROPOFOL 10 MG/ML IV BOLUS
INTRAVENOUS | Status: DC | PRN
  Administered 2018-05-04: 150 mg via INTRAVENOUS

## 2018-05-04 MED ORDER — OXYCODONE HCL 5 MG PO TABS
5.0000 mg | ORAL_TABLET | ORAL | Status: DC | PRN
  Administered 2018-05-04 – 2018-05-05 (×2): 10 mg via ORAL
  Administered 2018-05-05 (×4): 5 mg via ORAL
  Administered 2018-05-06: 10 mg via ORAL
  Filled 2018-05-04: qty 2
  Filled 2018-05-04: qty 1
  Filled 2018-05-04: qty 2
  Filled 2018-05-04 (×2): qty 1
  Filled 2018-05-04: qty 2
  Filled 2018-05-04: qty 1

## 2018-05-04 MED ORDER — LIDOCAINE 2% (20 MG/ML) 5 ML SYRINGE
INTRAMUSCULAR | Status: DC | PRN
  Administered 2018-05-04: 100 mg via INTRAVENOUS

## 2018-05-04 MED ORDER — CHLORHEXIDINE GLUCONATE 4 % EX LIQD: 30.0000 mL | CUTANEOUS | Status: DC

## 2018-05-04 MED ORDER — TRAMADOL HCL 50 MG PO TABS: 50.0000 mg | ORAL_TABLET | ORAL | Status: DC | PRN

## 2018-05-04 MED ORDER — DEXAMETHASONE SODIUM PHOSPHATE 10 MG/ML IJ SOLN
INTRAMUSCULAR | Status: DC | PRN
  Administered 2018-05-04: 10 mg via INTRAVENOUS

## 2018-05-04 MED ORDER — ROCURONIUM BROMIDE 50 MG/5ML IV SOSY
PREFILLED_SYRINGE | INTRAVENOUS | Status: AC
  Filled 2018-05-04: qty 15

## 2018-05-04 MED ORDER — VANCOMYCIN HCL IN DEXTROSE 1-5 GM/200ML-% IV SOLN
1000.0000 mg | Freq: Once | INTRAVENOUS | Status: AC
  Administered 2018-05-04: 1000 mg via INTRAVENOUS
  Filled 2018-05-04: qty 200

## 2018-05-04 MED ORDER — METOPROLOL TARTRATE 25 MG/10 ML ORAL SUSPENSION: 12.5000 mg | Freq: Two times a day (BID) | ORAL | Status: DC

## 2018-05-04 MED ORDER — MIDAZOLAM HCL 2 MG/2ML IJ SOLN: 2.0000 mg | INTRAMUSCULAR | Status: DC | PRN

## 2018-05-04 MED ORDER — CHLORHEXIDINE GLUCONATE 0.12 % MT SOLN
15.0000 mL | Freq: Once | OROMUCOSAL | Status: AC
  Administered 2018-05-04: 15 mL via OROMUCOSAL
  Filled 2018-05-04: qty 15

## 2018-05-04 MED ORDER — ROCURONIUM BROMIDE 10 MG/ML (PF) SYRINGE
PREFILLED_SYRINGE | INTRAVENOUS | Status: DC | PRN
  Administered 2018-05-04: 20 mg via INTRAVENOUS
  Administered 2018-05-04: 50 mg via INTRAVENOUS
  Administered 2018-05-04: 30 mg via INTRAVENOUS
  Administered 2018-05-04: 10 mg via INTRAVENOUS

## 2018-05-04 MED ORDER — BISACODYL 10 MG RE SUPP: 10.0000 mg | Freq: Every day | RECTAL | Status: DC

## 2018-05-04 MED ORDER — ASPIRIN EC 325 MG PO TBEC
325.0000 mg | DELAYED_RELEASE_TABLET | Freq: Every day | ORAL | Status: DC
  Administered 2018-05-05 – 2018-05-06 (×2): 325 mg via ORAL
  Filled 2018-05-04 (×2): qty 1

## 2018-05-04 MED ORDER — SODIUM CHLORIDE 0.9% FLUSH
3.0000 mL | Freq: Two times a day (BID) | INTRAVENOUS | Status: DC
  Administered 2018-05-05: 3 mL via INTRAVENOUS

## 2018-05-04 MED ORDER — KETOROLAC TROMETHAMINE 15 MG/ML IJ SOLN
15.0000 mg | Freq: Four times a day (QID) | INTRAMUSCULAR | Status: AC
  Administered 2018-05-04 – 2018-05-05 (×5): 15 mg via INTRAVENOUS
  Filled 2018-05-04 (×5): qty 1

## 2018-05-04 MED ORDER — HEPARIN SODIUM (PORCINE) 1000 UNIT/ML IJ SOLN
INTRAMUSCULAR | Status: AC
  Filled 2018-05-04: qty 1

## 2018-05-04 MED ORDER — LACTATED RINGERS IV SOLN
INTRAVENOUS | Status: DC
  Administered 2018-05-04: 20:00:00 via INTRAVENOUS

## 2018-05-04 MED ORDER — SODIUM CHLORIDE 0.9 % IV SOLN: INTRAVENOUS | Status: DC

## 2018-05-04 MED ORDER — SUGAMMADEX SODIUM 200 MG/2ML IV SOLN
INTRAVENOUS | Status: AC
  Filled 2018-05-04: qty 2

## 2018-05-04 MED ORDER — SODIUM CHLORIDE 0.9% FLUSH: 3.0000 mL | INTRAVENOUS | Status: DC | PRN

## 2018-05-04 MED ORDER — INSULIN REGULAR(HUMAN) IN NACL 100-0.9 UT/100ML-% IV SOLN: INTRAVENOUS | Status: DC

## 2018-05-04 SURGICAL SUPPLY — 61 items
BAG DECANTER FOR FLEXI CONT (MISCELLANEOUS) ×3 IMPLANT
BASKET HEART (ORDER IN 25'S) (MISCELLANEOUS) ×1
BASKET HEART (ORDER IN 25S) (MISCELLANEOUS) ×2 IMPLANT
BLADE CLIPPER SURG (BLADE) IMPLANT
BLADE STERNUM SYSTEM 6 (BLADE) ×3 IMPLANT
BNDG GAUZE ELAST 4 BULKY (GAUZE/BANDAGES/DRESSINGS) ×3 IMPLANT
CANISTER SUCT 3000ML PPV (MISCELLANEOUS) ×3 IMPLANT
CANNULA EZ GLIDE AORTIC 21FR (CANNULA) ×6 IMPLANT
CATH CPB KIT OWEN (MISCELLANEOUS) ×3 IMPLANT
CATH THORACIC 36FR (CATHETERS) IMPLANT
CRADLE DONUT ADULT HEAD (MISCELLANEOUS) ×3 IMPLANT
DRAIN CHANNEL 32F RND 10.7 FF (WOUND CARE) IMPLANT
DRAPE CARDIOVASCULAR INCISE (DRAPES) ×1
DRAPE INCISE IOBAN 66X45 STRL (DRAPES) ×3 IMPLANT
DRAPE SLUSH/WARMER DISC (DRAPES) ×3 IMPLANT
DRAPE SRG 135X102X78XABS (DRAPES) ×2 IMPLANT
DRSG AQUACEL AG ADV 3.5X14 (GAUZE/BANDAGES/DRESSINGS) ×3 IMPLANT
DRSG COVADERM 4X14 (GAUZE/BANDAGES/DRESSINGS) ×3 IMPLANT
ELECT BLADE 4.0 EZ CLEAN MEGAD (MISCELLANEOUS) ×3
ELECT REM PT RETURN 9FT ADLT (ELECTROSURGICAL) ×6
ELECTRODE BLDE 4.0 EZ CLN MEGD (MISCELLANEOUS) ×2 IMPLANT
ELECTRODE REM PT RTRN 9FT ADLT (ELECTROSURGICAL) ×4 IMPLANT
FELT TEFLON 1X6 (MISCELLANEOUS) ×6 IMPLANT
GAUZE SPONGE 4X4 12PLY STRL (GAUZE/BANDAGES/DRESSINGS) ×3 IMPLANT
GAUZE SPONGE 4X4 12PLY STRL LF (GAUZE/BANDAGES/DRESSINGS) ×3 IMPLANT
GLOVE ORTHO TXT STRL SZ7.5 (GLOVE) ×6 IMPLANT
GOWN STRL REUS W/ TWL LRG LVL3 (GOWN DISPOSABLE) ×8 IMPLANT
GOWN STRL REUS W/TWL LRG LVL3 (GOWN DISPOSABLE) ×4
HEMOSTAT POWDER SURGIFOAM 1G (HEMOSTASIS) ×6 IMPLANT
INSERT FOGARTY XLG (MISCELLANEOUS) ×3 IMPLANT
KIT BASIN OR (CUSTOM PROCEDURE TRAY) ×3 IMPLANT
KIT SUCTION CATH 14FR (SUCTIONS) ×6 IMPLANT
KIT TURNOVER KIT B (KITS) ×3 IMPLANT
LEAD PACING MYOCARDI (MISCELLANEOUS) ×3 IMPLANT
NS IRRIG 1000ML POUR BTL (IV SOLUTION) ×15 IMPLANT
PACK E OPEN HEART (SUTURE) ×3 IMPLANT
PACK OPEN HEART (CUSTOM PROCEDURE TRAY) ×3 IMPLANT
PAD ARMBOARD 7.5X6 YLW CONV (MISCELLANEOUS) ×6 IMPLANT
PAD ELECT DEFIB RADIOL ZOLL (MISCELLANEOUS) ×3 IMPLANT
PUNCH AORTIC ROTATE 4.0MM (MISCELLANEOUS) IMPLANT
PUNCH AORTIC ROTATE 4.5MM 8IN (MISCELLANEOUS) IMPLANT
PUNCH AORTIC ROTATE 5MM 8IN (MISCELLANEOUS) IMPLANT
SET CARDIOPLEGIA MPS 5001102 (MISCELLANEOUS) ×3 IMPLANT
SPONGE LAP 18X18 X RAY DECT (DISPOSABLE) IMPLANT
SUT BONE WAX W31G (SUTURE) ×3 IMPLANT
SUT ETHIBOND X763 2 0 SH 1 (SUTURE) ×6 IMPLANT
SUT MNCRL AB 3-0 PS2 18 (SUTURE) ×6 IMPLANT
SUT PDS AB 1 CTX 36 (SUTURE) ×6 IMPLANT
SUT PROLENE 3 0 SH DA (SUTURE) ×6 IMPLANT
SUT PROLENE 6 0 C 1 30 (SUTURE) ×6 IMPLANT
SUT SILK  1 MH (SUTURE) ×1
SUT SILK 1 MH (SUTURE) ×2 IMPLANT
SUT STEEL SZ 6 DBL 3X14 BALL (SUTURE) ×6 IMPLANT
SYR BULB IRRIGATION 50ML (SYRINGE) ×6 IMPLANT
SYSTEM SAHARA CHEST DRAIN ATS (WOUND CARE) ×3 IMPLANT
TAPE CLOTH SURG 4X10 WHT LF (GAUZE/BANDAGES/DRESSINGS) ×3 IMPLANT
TOWEL GREEN STERILE (TOWEL DISPOSABLE) ×3 IMPLANT
TOWEL GREEN STERILE FF (TOWEL DISPOSABLE) ×3 IMPLANT
TRAY FOLEY SLVR 16FR TEMP STAT (SET/KITS/TRAYS/PACK) ×3 IMPLANT
UNDERPAD 30X30 (UNDERPADS AND DIAPERS) ×3 IMPLANT
WATER STERILE IRR 1000ML POUR (IV SOLUTION) ×6 IMPLANT

## 2018-05-04 NOTE — Transfer of Care (Signed)
Immediate Anesthesia Transfer of Care Note  Patient: Tobi Bastoserrence Agresti  Procedure(s) Performed: CORONARY ARTERY BYPASS GRAFTING (CABG) X ONE USING RIGHT INTERNAL MAMMARY ARTERY (N/A Chest) TRANSESOPHAGEAL ECHOCARDIOGRAM (TEE) (N/A )  Patient Location: SICU  Anesthesia Type:General  Level of Consciousness: sedated  Airway & Oxygen Therapy: Patient Spontanous Breathing and Patient connected to face mask oxygen  Post-op Assessment: Report given to RN and Post -op Vital signs reviewed and stable  Post vital signs: Reviewed and stable  Last Vitals:  Vitals Value Taken Time  BP 115/68 05/04/2018 11:45 AM  Temp 36.3 C 05/04/2018 11:46 AM  Pulse 90 05/04/2018 11:46 AM  Resp 16 05/04/2018 11:46 AM  SpO2 100 % 05/04/2018 11:46 AM  Vitals shown include unvalidated device data.  Last Pain:  Vitals:   05/04/18 0608  TempSrc:   PainSc: 0-No pain         Complications: No apparent anesthesia complications

## 2018-05-04 NOTE — Op Note (Addendum)
CARDIOTHORACIC SURGERY OPERATIVE NOTE  Date of Procedure: 05/04/2018  Preoperative Diagnosis: Anomalous Right Coronary Artery  Postoperative Diagnosis: Same  Procedure:    Coronary Artery Bypass Grafting x 1   Right Internal Mammary Artery to Distal RightDescending Coronary Artery  Surgeon: Salvatore Decent. Cornelius Moras, MD  Assistant: Rowe Clack, PA-C  Anesthesia: Leslye Peer, MD  Operative Findings:  Normal left ventricular systolic function  Good quality right internal mammary artery conduit  Good quality target vessel for grafting    BRIEF CLINICAL NOTE AND INDICATIONS FOR SURGERY  Patient is a 27 year old African-American male with history of exertional chest pain, shortness of breath, dizziness, and syncope who has been referred for surgical consultation to discuss treatment options for management of anomalous right coronary artery.  Patient states that he first began to experience symptoms of exertional chest pain in 2011. He describes occasional episodes of "tearing" pain across his chest that is usually associated with strenuous physical exertion and relieved by rest. Symptoms do not always occur when he is exerting himself, but they have become problematic and fairly frequent. The patient also experiences shortness of breath, although the shortness of breath does not necessarily coincide with episodes of chest pain or strenuous activity. The patient has occasional palpitations dizzy spells. He has had at least 2 syncopal episodes. Syncopal episodes were not preceded by strenuous activity. His first syncopal episode occurred in December 2016. 1 month later he underwent an echocardiogram that was normal. The patient has been seen in the emergency room on several occasions, each time with normal EKGs and blood work. Troponins have always been 0. He saw a cardiologist in 2018 and reportedly underwent another echocardiogram that was reportedly normal. Routine chest x-rays  have been normal.  Patient presented to the emergency room January 14, 2018 with another episode of chest pain that was sudden onset and associated with lightheadedness although the patient did not completely lose conscious. He was given aspirin and nitroglycerin by EMS but symptoms were not relieved. EKG was normal and troponins negative. Chest x-ray was clear. Patient was referred to Dr. Royston Cowper subsequently underwent cardiac gated CT angiogram of the heart. 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 This was associated with a course that ran between the aorta and the pulmonary artery and felt potentially a set up for compression of the first portion of the right coronary artery and a potential source of angina and/or myocardial ischemia. A nuclear stress test was performed andreportedly low risk.Cardiothoracic surgical consultation was requested.  The patient has been seen in consultation and counseled at length regarding the indications, risks and potential benefits of surgery.  All questions have been answered, and the patient provides full informed consent for the operation as described.    DETAILS OF THE OPERATIVE PROCEDURE  Preparation:  The patient is brought to the operating room on the above mentioned date and central monitoring was established by the anesthesia team including placement of Swan-Ganz catheter and radial arterial line. The patient is placed in the supine position on the operating table.  Intravenous antibiotics are administered. General endotracheal anesthesia is induced uneventfully. A Foley catheter is placed.  Baseline transesophageal echocardiogram was performed.  Findings were notable for normal LV systolic function.  The patient's chest, abdomen, both groins, and both lower extremities are prepared and draped in a sterile manner. A time out procedure is performed.   Surgical Approach and Conduit Harvest:  A  median sternotomy incision was performed  and the right internal mammary artery is dissected from the chest wall and prepared for bypass grafting. The right internal mammary artery is notably good quality conduit. Following systemic heparinization, the right internal mammary artery was transected distally noted to have excellent flow.   Extracorporeal Cardiopulmonary Bypass and Myocardial Protection:  The pericardium is opened. The ascending aorta is normal in appearance. The ascending aorta and the right atrium are cannulated for cardiopulmonary bypass.  Adequate heparinization is verified.    The entire pre-bypass portion of the operation was notable for stable hemodynamics.  Cardiopulmonary bypass was begun and the surface of the heart is inspected. The distal target vessel is selected for coronary artery bypass grafting. A cardioplegia cannula is placed in the ascending aorta.    The patient is allowed to cool passively to [facility]32C systemic temperature.  The aortic cross clamp is applied and cold blood cardioplegia is delivered initially in an antegrade fashion through the aortic root. Iced saline slush is applied for topical hypothermia.  The initial cardioplegic arrest is rapid with early diastolic arrest.   Myocardial protection was felt to be excellent.  Coronary Artery Bypass Grafting:   The distal right coronary artery was grafted with the right internal mammary artery in an end-to-side fashion.  At the site of distal anastomosis the target vessel was good quality and measured approximately 2.2 mm in diameter.  The aortic cross clamp was removed after a total cross clamp time of 20 minutes.   Procedure Completion:  The distal coronary anastomosis was inspected for hemostasis and appropriate graft orientation. Epicardial pacing wires are fixed to the right ventricular outflow tract and to the right atrial appendage. The patient is rewarmed to 37C temperature. The patient is weaned and  disconnected from cardiopulmonary bypass.  The patient's rhythm at separation from bypass was sinus.  The patient was weaned from cardiopulmonary bypass without any inotropic support. Total cardiopulmonary bypass time for the operation was 32 minutes.  Followup transesophageal echocardiogram performed after separation from bypass revealed no changes from the preoperative exam.  The aortic and venous cannula were removed uneventfully. Protamine was administered to reverse the anticoagulation. The mediastinum and pleural space were inspected for hemostasis and irrigated with saline solution. The mediastinum and the right pleural space were drained using 3 chest tubes placed through separate stab incisions inferiorly.  The soft tissues anterior to the aorta were reapproximated loosely. The sternum is closed with double strength sternal wire. The soft tissues anterior to the sternum were closed in multiple layers and the skin is closed with a running subcuticular skin closure.  The post-bypass portion of the operation was notable for stable rhythm and hemodynamics.  No blood products were administered during the operation.  Estimated blood loss was 325 mL   Disposition:  The patient tolerated the procedure well and was extubated in the operating room and transported to the surgical intensive care in stable condition. There are no intraoperative complications. All sponge instrument and needle counts are verified correct at completion of the operation.    Salvatore Decentlarence H. Cornelius Moraswen MD 05/04/2018 10:43 AM

## 2018-05-04 NOTE — Anesthesia Procedure Notes (Signed)
Arterial Line Insertion Start/End11/15/2019 7:36 AM, 05/04/2018 7:00 AM Performed by: Dairl PonderJiang, Jennie Bolar, CRNA  Patient location: Pre-op. Preanesthetic checklist: patient identified, IV checked, site marked, risks and benefits discussed, surgical consent, monitors and equipment checked, pre-op evaluation, timeout performed and anesthesia consent Lidocaine 1% used for infiltration Left, radial was placed Catheter size: 20 Fr Hand hygiene performed  and maximum sterile barriers used   Attempts: 1 Procedure performed without using ultrasound guided technique. Following insertion, dressing applied. Post procedure assessment: normal and unchanged

## 2018-05-04 NOTE — Anesthesia Procedure Notes (Signed)
Central Venous Catheter Insertion Performed by: Cecile Hearingurk, Obdulia Steier Edward, MD, anesthesiologist Start/End11/15/2019 7:10 AM, 05/04/2018 7:15 AM Patient location: Pre-op. Preanesthetic checklist: patient identified, IV checked, site marked, risks and benefits discussed, surgical consent, monitors and equipment checked, pre-op evaluation, timeout performed and anesthesia consent Hand hygiene performed  and maximum sterile barriers used  Total catheter length 100. PA cath was placed.Swan type:thermodilution PA Cath depth:50 Procedure performed without using ultrasound guided technique. Attempts: 1 Patient tolerated the procedure well with no immediate complications.

## 2018-05-04 NOTE — Anesthesia Procedure Notes (Signed)
Central Venous Catheter Insertion Performed by: Cecile Hearingurk, Eulalio Reamy Edward, MD, anesthesiologist Start/End11/15/2019 7:00 AM, 05/04/2018 7:10 AM Patient location: Pre-op. Preanesthetic checklist: patient identified, IV checked, site marked, risks and benefits discussed, surgical consent, monitors and equipment checked, pre-op evaluation, timeout performed and anesthesia consent Position: Trendelenburg Lidocaine 1% used for infiltration and patient sedated Hand hygiene performed , maximum sterile barriers used  and Seldinger technique used Catheter size: 8.5 Fr Total catheter length 10. Central line was placed.Sheath introducer Swan type:thermodilution PA Cath depth:50 Procedure performed using ultrasound guided technique. Ultrasound Notes:anatomy identified, needle tip was noted to be adjacent to the nerve/plexus identified, no ultrasound evidence of intravascular and/or intraneural injection and image(s) printed for medical record Attempts: 1 Following insertion, line sutured, dressing applied and Biopatch. Post procedure assessment: blood return through all ports, free fluid flow and no air  Patient tolerated the procedure well with no immediate complications.

## 2018-05-04 NOTE — Progress Notes (Signed)
  Echocardiogram Echocardiogram Transesophageal has been performed.  Delcie RochENNINGTON, [place] Kirchner 05/04/2018, 10:26 AM

## 2018-05-04 NOTE — Anesthesia Procedure Notes (Signed)
Procedure Name: Intubation Date/Time: 05/04/2018 7:58 AM Performed by: Clearnce Sorrel, CRNA Pre-anesthesia Checklist: Patient identified, Emergency Drugs available, Suction available, Patient being monitored and Timeout performed Patient Re-evaluated:Patient Re-evaluated prior to induction Oxygen Delivery Method: Circle system utilized Preoxygenation: Pre-oxygenation with 100% oxygen Induction Type: IV induction Ventilation: Mask ventilation without difficulty Laryngoscope Size: Mac and 4 Grade View: Grade I Tube type: Oral Tube size: 8.0 mm Number of attempts: 1 Airway Equipment and Method: Stylet Placement Confirmation: ETT inserted through vocal cords under direct vision,  positive ETCO2 and breath sounds checked- equal and bilateral Secured at: 24 cm Tube secured with: Tape Dental Injury: Teeth and Oropharynx as per pre-operative assessment

## 2018-05-04 NOTE — Anesthesia Postprocedure Evaluation (Signed)
Anesthesia Post Note  Patient: Pensions consultantTerrence Harper  Procedure(s) Performed: CORONARY ARTERY BYPASS GRAFTING (CABG) X ONE USING RIGHT INTERNAL MAMMARY ARTERY (N/A Chest) TRANSESOPHAGEAL ECHOCARDIOGRAM (TEE) (N/A )     Patient location during evaluation: ICU Anesthesia Type: General Level of consciousness: awake Pain management: pain level controlled Vital Signs Assessment: post-procedure vital signs reviewed and stable Respiratory status: spontaneous breathing, nonlabored ventilation and respiratory function stable Cardiovascular status: blood pressure returned to baseline and stable Postop Assessment: no apparent nausea or vomiting Anesthetic complications: no    Last Vitals:  Vitals:   05/04/18 1200 05/04/18 1300  BP: 110/67 102/68  Pulse: 90 92  Resp: (!) 21 (!) 22  Temp: (!) 36.2 C 36.5 C  SpO2: 100% 100%    Last Pain:  Vitals:   05/04/18 1200  TempSrc: Core  PainSc:                  Jerry Harper

## 2018-05-04 NOTE — Interval H&P Note (Signed)
History and Physical Interval Note:  05/04/2018 5:42 AM  Jerry Harper  has presented today for surgery, with the diagnosis of ANOMOLOUS RIGHT CORONARY ARTERY  The various methods of treatment have been discussed with the patient and family. After consideration of risks, benefits and other options for treatment, the patient has consented to  Procedure(s): CORONARY ARTERY BYPASS GRAFTING (CABG) X 1 (N/A) TRANSESOPHAGEAL ECHOCARDIOGRAM (TEE) (N/A) as a surgical intervention .  The patient's history has been reviewed, patient examined, no change in status, stable for surgery.  I have reviewed the patient's chart and labs.  Questions were answered to the patient's satisfaction.     Purcell Nailslarence H Owen

## 2018-05-04 NOTE — Progress Notes (Signed)
Patient ID: Jerry Harper, male   DOB: Jan 13, 1991, 27 y.o.   MRN: [id number] EVENING ROUNDS NOTE :     [address]       [place] [id number]             [phone]                 Day of Surgery Procedure(s) (LRB): CORONARY ARTERY BYPASS GRAFTING (CABG) X ONE USING RIGHT INTERNAL MAMMARY ARTERY (N/A) TRANSESOPHAGEAL ECHOCARDIOGRAM (TEE) (N/A)  Total Length of Stay:  LOS: 0 days  BP 103/68   Pulse 94   Temp 98.1 F (36.7 C)   Resp (!) 21   Ht 5\' 11"  (1.803 m)   Wt 82.1 kg   SpO2 100%   BMI 25.24 kg/m   .Intake/Output      11/14 0701 - 11/15 0700 11/15 0701 - 11/16 0700   I.V. (mL/kg)  1848.6 (22.5)   Blood  450   IV Piggyback  47   Total Intake(mL/kg)  2345.6 (28.6)   Urine (mL/kg/hr)  1750 (2.3)   Blood  325   Chest Tube  260   Total Output  2335   Net  +10.6          . sodium chloride    . sodium chloride    . [START ON 05/05/2018] sodium chloride    . sodium chloride    . albumin human 12.5 g (05/04/18 1400)  . cefUROXime (ZINACEF)  IV    . dexmedetomidine (PRECEDEX) IV infusion    . famotidine (PEPCID) IV    . insulin    . lactated ringers    . lactated ringers    . lactated ringers    . magnesium sulfate 20 mL/hr at 05/04/18 1300  . nitroGLYCERIN Stopped (05/04/18 1228)  . phenylephrine (NEO-SYNEPHRINE) Adult infusion 20 mcg/min (05/04/18 1300)  . vancomycin       Lab Results  Component Value Date   WBC 18.8 (H) 05/04/2018   HGB 11.6 (L) 05/04/2018   HCT 34.0 (L) 05/04/2018   PLT 202 05/04/2018   GLUCOSE 110 (H) 05/04/2018   ALT 16 04/30/2018   AST 17 04/30/2018   NA 141 05/04/2018   K 3.6 05/04/2018   CL 104 05/04/2018   CREATININE 0.70 05/04/2018   BUN 11 05/04/2018   CO2 24 04/30/2018   INR 1.33 05/04/2018   HGBA1C 4.4 (L) 04/30/2018   Stable postop  Extubated in or Not bleeding   Delight OvensEdward B Latissa Frick MD  Beeper [phone] Office [phone] 05/04/2018 4:06 PM

## 2018-05-04 NOTE — H&P (Signed)
[address]       [place] [address]             [phone]          CARDIOTHORACIC SURGERY HISTORY AND PHYSICAL EXAM  Referring Provider is Wendall Stade, MD PCP is Patient, [name]      Chief Complaint  Patient presents with  . New Patient (Initial Visit)    new patient consultation, chest pain, right anomalous coronary artery, Cardiac CT 03/05/18    HPI:  Patient is a 27 year old African-American male with history of exertional chest pain, shortness of breath, dizziness, and syncope who has been referred for surgical consultation to discuss treatment options for management of anomalous right coronary artery.  Patient states that he first began to experience symptoms of exertional chest pain in 2011.  He describes occasional episodes of "tearing" pain across his chest that is usually associated with strenuous physical exertion and relieved by rest.  Symptoms do not always occur when he is exerting himself, but they have become problematic and fairly frequent.  The patient also experiences shortness of breath, although the shortness of breath does not necessarily coincide with episodes of chest pain or strenuous activity.  The patient has occasional palpitations dizzy spells.  He has had at least 2 syncopal episodes.   Syncopal episodes were not preceded by strenuous activity.  His first syncopal episode occurred in December 2016.  1 month later he underwent an echocardiogram that was normal. The patient has been seen in the emergency room on several occasions, each time with normal EKGs and blood work.  Troponins have always been 0.  He saw a cardiologist in 2018 and reportedly underwent another echocardiogram that was reportedly normal.  Routine chest x-rays have been normal.  Patient presented to the emergency room January 14, 2018 with another episode of chest pain that was sudden onset and associated with lightheadedness although the patient did not  completely lose conscious.  He was given aspirin and nitroglycerin by EMS but symptoms were not relieved.  EKG was normal and troponins negative.  Chest x-ray was clear.  Patient was referred to Dr. Eden Emms and subsequently underwent cardiac gated CT angiogram of the heart.  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  This was associated with a course that ran between the aorta and the pulmonary artery and felt potentially a set up for compression of the first portion of the right coronary artery and a potential source of angina and/or myocardial ischemia.  A nuclear stress test was performed and reportedly low risk.  Cardiothoracic surgical consultation was requested.    Past Medical History:  Diagnosis Date  . Anomalous origin of right coronary artery   . Dyspnea   . Hypertension     Past Surgical History:  Procedure Laterality Date  . TONSILLECTOMY      Family History  Problem Relation Age of Onset  . Sickle cell anemia Father     Social History Social History   Tobacco Use  . Smoking status: Never Smoker  . Smokeless tobacco: Never Used  Substance Use Topics  . Alcohol use: No  . Drug use: No    Prior to Admission medications   Not on File    Allergies  Allergen Reactions  . Peanut-Containing Drug Products Hives and Swelling    Throat swelling      Review of Systems:  General:                      normal appetite, normal energy, no weight gain, no weight loss, no fever             Cardiac:                       + chest pain with exertion, no chest pain at rest, +SOB with exertion, occasional resting SOB, no PND, no orthopnea, + palpitations, no arrhythmia, no atrial fibrillation, no LE edema, + dizzy spells, + syncope             Respiratory:                 + shortness of breath, no home oxygen, no productive cough, no dry cough, no bronchitis, no wheezing, no hemoptysis, no asthma, no pain with  inspiration or cough, no sleep apnea, no CPAP at night             GI:                               no difficulty swallowing, no reflux, no frequent heartburn, no hiatal hernia, no abdominal pain, no constipation, no diarrhea, no hematochezia, no hematemesis, no melena             GU:                              no dysuria,  no frequency, no urinary tract infection, no hematuria, no enlarged prostate, no kidney stones, no kidney disease             Vascular:                     no pain suggestive of claudication, no pain in feet, no leg cramps, no varicose veins, no DVT, no non-healing foot ulcer             Neuro:                         no stroke, no TIA's, no seizures, no headaches, no temporary blindness one eye,  no slurred speech, no peripheral neuropathy, no chronic pain, no instability of gait, no memory/cognitive dysfunction             Musculoskeletal:         no arthritis, no joint swelling, no myalgias, no difficulty walking, normal mobility              Skin:                            no rash, no itching, no skin infections, no pressure sores or ulcerations             Psych:                         no anxiety, no depression, no nervousness, no unusual recent stress             Eyes:                           no blurry vision, no floaters, no recent vision changes, +  wears glasses or contacts             ENT:                            no hearing loss, no loose or painful teeth, no dentures             Hematologic:               no easy bruising, no abnormal bleeding, no clotting disorder, no frequent epistaxis             Endocrine:                   no diabetes, does not check CBG's at home                           Physical Exam:              BP 124/84 (BP Location: Right Arm, Patient Position: Sitting, Cuff Size: Normal)   Pulse 94   Resp 18   Ht 5\' 11"  (1.803 m)   Wt 187 lb 3.2 oz (84.9 kg)   SpO2 96% Comment: ra  BMI 26.11 kg/m              General:                         well-appearing             HEENT:                       Unremarkable              Neck:                           no JVD, no bruits, no adenopathy              Chest:                          clear to auscultation, symmetrical breath sounds, no wheezes, no rhonchi              CV:                              RRR, no  murmur              Abdomen:                    soft, non-tender, no masses              Extremities:                 warm, well-perfused, pulses palpable, no LE edema             Rectal/GU                   Deferred             Neuro:                         Grossly non-focal and symmetrical throughout             Skin:  Clean and dry, no rashes, no breakdown   Diagnostic Tests:  Transthoracic Echocardiography  Patient: Jerry Harper, Jerry Harper MR #: [id number] Study Date: 06/24/2015 Gender: M Age: 44 Height: 180.3 cm Weight: 72.6 kg BSA: 1.91 m^2 Pt. Status: Room:  ATTENDING Doug Sou [id number] SONOGRAPHER Cathie Beams [place] [id number] PERFORMING [facility], Inpatient  cc:  ------------------------------------------------------------------- LV EF: 55% - 60%  ------------------------------------------------------------------- Indications: Chest pain 786.51.  ------------------------------------------------------------------- History: PMH: Syncope. Risk factors: Hypertension.  ------------------------------------------------------------------- Study Conclusions  - Left ventricle: The cavity size was normal. Wall thickness was normal. Systolic function was normal. The estimated ejection fraction was in the range of 55% to 60%. Wall motion was normal; there were no regional wall motion abnormalities. Left ventricular diastolic function parameters were normal.  Transthoracic echocardiography. M-mode, complete 2D, spectral Doppler, and color  Doppler. Birthdate: Patient birthdate: [birth year]. Age: Patient is 26 yr old. Sex: Gender: male. BMI: 22.3 kg/m^2. Blood pressure: 103/88 Patient status: Inpatient. Study date: Study date: 06/24/2015. Study time: 08:55 AM. Location: Emergency department.  -------------------------------------------------------------------  ------------------------------------------------------------------- Left ventricle: The cavity size was normal. Wall thickness was normal. Systolic function was normal. The estimated ejection fraction was in the range of 55% to 60%. Wall motion was normal; there were no regional wall motion abnormalities. The transmitral flow pattern was normal. The deceleration time of the early transmitral flow velocity was normal. The pulmonary vein flow pattern was normal. The tissue Doppler parameters were normal. Left ventricular diastolic function parameters were normal.  ------------------------------------------------------------------- Aortic valve: Structurally normal valve. Trileaflet. Cusp separation was normal. Doppler: Transvalvular velocity was within the normal range. There was no stenosis. There was no regurgitation.  ------------------------------------------------------------------- Aorta: The aorta was normal, not dilated, and non-diseased.  ------------------------------------------------------------------- Mitral valve: Structurally normal valve. Leaflet separation was normal. Doppler: Transvalvular velocity was within the normal range. There was no evidence for stenosis. There was no regurgitation.  ------------------------------------------------------------------- Left atrium: The atrium was normal in size.  ------------------------------------------------------------------- Right ventricle: The cavity size was normal. Wall thickness was normal. Systolic function was  normal.  ------------------------------------------------------------------- Pulmonic valve: Structurally normal valve. Cusp separation was normal. Doppler: Transvalvular velocity was within the normal range. There was no regurgitation.  ------------------------------------------------------------------- Tricuspid valve: Doppler: There was mild regurgitation.  ------------------------------------------------------------------- Right atrium: The atrium was at the upper limits of normal in size.  ------------------------------------------------------------------- Pericardium: There was no pericardial effusion.  ------------------------------------------------------------------- Systemic veins: Inferior vena cava: The vessel was normal in size. The respirophasic diameter changes were in the normal range (>= 50%), consistent with normal central venous pressure.  ------------------------------------------------------------------- Post procedure conclusions Ascending Aorta:  - The aorta was normal, not dilated, and non-diseased.  ------------------------------------------------------------------- Measurements  Left ventricle Value Reference LV ID, ED, PLAX chordal (L) 40.4 mm 43 - 52 LV ID, ES, PLAX chordal 28.4 mm 23 - 38 LV fx shortening, PLAX chordal 30 % >=29 LV PW thickness, ED 9.22 mm --------- IVS/LV PW ratio, ED 1.17 <=1.3 LV e&', lateral 12.9 cm/s --------- LV E/e&', lateral 5.26 --------- LV e&', medial 13.8 cm/s --------- LV E/e&', medial 4.91 --------- LV e&', average 13.35 cm/s --------- LV E/e&', average 5.08  ---------  Ventricular septum Value Reference IVS thickness, ED 10.8 mm ---------  LVOT Value Reference LVOT ID, S 20 mm --------- LVOT area 3.14 cm^2 ---------  Aorta Value Reference Aortic root ID, ED 29 mm ---------  Left atrium Value Reference LA ID, A-P, ES 27 mm --------- LA ID/bsa, A-P 1.42 cm/m^2 <=2.2 LA volume, S 47.4 ml --------- LA volume/bsa, S  24.9 ml/m^2 --------- LA volume, ES, 1-p A4C 51.3 ml --------- LA volume/bsa, ES, 1-p A4C 26.9 ml/m^2 --------- LA volume, ES, 1-p A2C 43.1 ml --------- LA volume/bsa, ES, 1-p A2C 22.6 ml/m^2 ---------  Mitral valve Value Reference Mitral E-wave peak velocity 67.8 cm/s --------- Mitral A-wave peak velocity 47 cm/s --------- Mitral deceleration time 204 ms 150 - 230 Mitral E/A ratio, peak 1.4 ---------  Right ventricle Value Reference RV s&', lateral, S 12 cm/s ---------  Legend: (L) and (H) mark values outside specified reference range.  ------------------------------------------------------------------- Prepared and Electronically Authenticated by  Cassell Clement, MD 2017-01-04T11:28:32    Cardiac CTA  MEDICATIONS: Sub lingual nitro. 4mg  and lopressor 10mg   TECHNIQUE: The patient was scanned on a Siemens Force 192 slice scanner.  Gantry rotation speed was 250 msecs. Collimation was. 6 mm . A 100 kV prospective scan was triggered in the ascending thoracic aorta at 140 HU's with full mA between 30-70% R-R interval. Average HR during the scan was 69 bpm. The 3D data set was interpreted on a dedicated work station using NPR, MIP and VRT modes. A total of 80 cc of contrast was used.  FINDINGS: Non-cardiac: See separate report from [facility]. No significant findings on limited lung and soft tissue windows.  Calcium Score: No calcium detected  Coronary Arteries: Right dominant  LM: Normal comes off left cusp  LAD: Normal  D1: Normal  D2: Normal  D3: Normal  Circumflex: Normal  OM1: Normal  AV Groove: Normal  RCA: Appears to have an anomalous origin from the left cusp either abutting or common ostium to LM. Proximal vessel somewhat slit like and runs between the MPA and aorta  PDA: Normal  PLA: Normal  IMPRESSION: 1. Calcium score 0  2. Normal aortic root 2.9 cm  3. Anomalous RCA origin from left cusp abutting or common ostium to LM Course does run between the MPA and aortic root suggesting possible source of angina/chest pain  Will review with colleagues  Charlton Haws   Electronically Signed By: Charlton Haws M.D. On: 03/05/2018 17:13    Result Notes for MYOCARDIAL PERFUSION IMAGING   Notes recorded by Ethelda Chick, RN on 03/21/2018 at 2:46 PM EDT Patient aware of myoview. Per Dr. Eden Emms, No evidence of inferior wall ischemia normal EF good Normal ECG response to exercise. Still needs f/u with Dr Cornelius Moras to discuss Rx anomalous RCA. Patient verbalized understanding and will keep his appointment with Dr. Cornelius Moras on 10/17. ------  Notes recorded by Wendall Stade, MD on 03/21/2018 at 12:44 PM EDT No evidence of inferior wall ischemia normal EF good Normal ECG response to exercise. Still needs f/u with Dr Cornelius Moras to discuss Rx anomalous RCA       Vitals   Height Weight BMI (Calculated)  5\' 11"  (1.803 m) 187 lb 3.2 oz (84.9 kg) 26.12  Study Highlights    The left ventricular ejection fraction is normal (55-65%).  Nuclear stress EF is calculated at 52% but visually appears to be 60%.  Blood pressure demonstrated a normal response to exercise.  Baseline EKG showed NSR with LVH and early repolarization abnormality. During infusino there were T wave inversions in the inferolateral leads.  The study is normal.  This is a low risk study.   Nuclear History and Indications   History and Indications Indication for Stress Test: Risk stratification /preoperative History: 7/19 ER- Presyncope, chest pain, dyspnea, Abnormal CT with anomalous coronary artery anatomy Cardiac Risk Factors: Hypertension  Symptoms: Chest Pain,  DOE and Near Syncope  Stress Findings   ECG Baseline ECG exhibits normal sinus rhythm.Baseline ECG indicates non-specific ST-T wave changes. NSR with LVh and early repolarization .  Stress Findings The patient exercised following the Bruce protocol.  The patient reported dyspnea and fatigue during the stress test. The patient experienced no angina during the stress test.   The test was stopped because the patient complained of fatigue.   Blood pressure and heart rate demonstrated a normal response to exercise. Blood pressure demonstrated a normal response to exercise. Overall, the patient's exercise capacity was excellent.   The patient's response to exercise was adequate for diagnosis.  Response to Stress Arrhythmias during stress: none.  Arrhythmias during recovery: none.  There were no significant arrhythmias noted during the test.  ECG was interpretable and conclusive.  Stress Measurements      Baseline Vitals  Rest HR 79 bpm    Rest BP 102/73 mmHg    Exercise Time  Exercise duration (min) 11 min    Peak Stress Vitals  Peak HR 181 bpm    Peak BP 157/78 mmHg    Exercise Data  MPHR 192  bpm    Percent HR 94 %    RPE 18     Estimated workload 13.4 METS       Nuclear Stress Measurements   LV sys vol 46 mL    TID 0.99     LV dias vol 94 mL    SSS 3     SRS 1     SDS 2          Nuclear Stress Findings   Isotope administration Rest isotope was administered with an IV injection of 10.4 mCi Tc8m Tetrofosmin. Rest SPECT images were obtained approximately 45 minutes post tracer injection. Stress isotope was administered with an IV injection of 31.5 mCi Tc85m Tetrofosmin at peak exercise.  Nuclear Measurements Study was gated.  Overall Study Impression Myocardial perfusion is normal. The study is normal. This is a low risk study. Overall left ventricular systolic function was normal. LV cavity size is normal. Nuclear stress EF: 52%. The left ventricular ejection fraction is normal (55-65%). There is no prior study for comparison.  From: ACCF/SCAI/STS/AATS/AHA/ASNC/HFSA/SCCT 2012 Appropriate Use Criteria for Coronary Revascularization Focused Update  Wall Scoring                Score Index: 1.000 Percent Normal: 100.0%             The left ventricular wall motion is normal.               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 The vessel courses intra-arterial at an acute angle between the pulmonary artery and the aorta. He has right dominant coronary circulation. Left coronary circulation is entirely normal. There is no calcification and no other sign of atherosclerotic disease.  Patient has recurrent symptoms of exertional chest pain and shortness of breath as well as 2 frank syncopal episodes.  Options include continued observation versus elective surgical intervention.  Surgical options include conventional surgical  revascularization using coronary artery bypass grafting versus "unroofing" or reimplantation of the proximal segment of the right coronary artery through the aortic root.     Plan:  I have  personally reviewed the patient's cardiac gated CT angiogram and discuss findings at length with the patient and his wife in the office today.  I have previously reviewed his case with several of my partners.  We plan coronary artery bypass grafting using right internal mammary artery graft to the right coronary artery in the operating room later this week.  The patient understands and accepts all potential associated risks of surgery including but not limited to risk of death, stroke or other neurologic complication, myocardial infarction, congestive heart failure, respiratory failure, renal failure, bleeding requiring blood transfusion and/or reexploration, aortic dissection or other major vascular complication, arrhythmia, heart block or bradycardia requiring permanent pacemaker, pneumonia, pleural effusion, wound infection, pulmonary embolus or other thromboembolic complication, chronic pain or other delayed complications related to median sternotomy, or the late recurrence of symptomatic ischemic heart disease and/or congestive heart failure.  The possibility of late atresia of the bypass graft caused by competitive blood flow has been discussed.  Expectations for the patient's postoperative convalescence have been discussed..  All questions answered.     I spent in excess of 15 minutes during the conduct of this office consultation and >50% of this time involved direct face-to-face encounter with the patient for counseling and/or coordination of their care.    Salvatore Decent. Cornelius Moras, MD 04/30/2018 12:38 PM

## 2018-05-05 ENCOUNTER — Inpatient Hospital Stay (HOSPITAL_COMMUNITY): Payer: Self-pay

## 2018-05-05 LAB — POCT I-STAT, CHEM 8
BUN: 9 mg/dL (ref 6–20)
BUN: 9 mg/dL (ref 6–20)
CHLORIDE: 109 mmol/L (ref 98–111)
CREATININE: 0.7 mg/dL (ref 0.61–1.24)
CREATININE: 0.9 mg/dL (ref 0.61–1.24)
Calcium, Ion: 1.41 mmol/L — ABNORMAL HIGH (ref 1.15–1.40)
Calcium, Ion: 1.19 mmol/L (ref 1.15–1.40)
Chloride: 101 mmol/L (ref 98–111)
GLUCOSE: 115 mg/dL — AB (ref 70–99)
Glucose, Bld: 113 mg/dL — ABNORMAL HIGH (ref 70–99)
HEMATOCRIT: 31 % — AB (ref 39.0–52.0)
HEMATOCRIT: 35 % — AB (ref 39.0–52.0)
HEMOGLOBIN: 11.9 g/dL — AB (ref 13.0–17.0)
Hemoglobin: 10.5 g/dL — ABNORMAL LOW (ref 13.0–17.0)
POTASSIUM: 3.7 mmol/L (ref 3.5–5.1)
Potassium: 4.6 mmol/L (ref 3.5–5.1)
Sodium: 140 mmol/L (ref 135–145)
Sodium: 138 mmol/L (ref 135–145)
TCO2: 22 mmol/L (ref 22–32)
TCO2: 28 mmol/L (ref 22–32)

## 2018-05-05 LAB — CBC
HCT: 34 % — ABNORMAL LOW (ref 39.0–52.0)
HEMATOCRIT: 34.8 % — AB (ref 39.0–52.0)
HEMOGLOBIN: 11.4 g/dL — AB (ref 13.0–17.0)
Hemoglobin: 11 g/dL — ABNORMAL LOW (ref 13.0–17.0)
MCH: 29.6 pg (ref 26.0–34.0)
MCH: 30 pg (ref 26.0–34.0)
MCHC: 32.4 g/dL (ref 30.0–36.0)
MCHC: 32.8 g/dL (ref 30.0–36.0)
MCV: 91.6 fL (ref 80.0–100.0)
MCV: 91.6 fL (ref 80.0–100.0)
NRBC: 0 % (ref 0.0–0.2)
PLATELETS: 187 10*3/uL (ref 150–400)
Platelets: 190 10*3/uL (ref 150–400)
RBC: 3.71 MIL/uL — AB (ref 4.22–5.81)
RBC: 3.8 MIL/uL — AB (ref 4.22–5.81)
RDW: 11.8 % (ref 11.5–15.5)
RDW: 12.1 % (ref 11.5–15.5)
WBC: 12.9 10*3/uL — ABNORMAL HIGH (ref 4.0–10.5)
WBC: 13.5 10*3/uL — AB (ref 4.0–10.5)
nRBC: 0 % (ref 0.0–0.2)

## 2018-05-05 LAB — CREATININE, SERUM: Creatinine, Ser: 0.98 mg/dL (ref 0.61–1.24)

## 2018-05-05 LAB — BASIC METABOLIC PANEL
ANION GAP: 6 (ref 5–15)
BUN: 7 mg/dL (ref 6–20)
CO2: 23 mmol/L (ref 22–32)
Calcium: 8.6 mg/dL — ABNORMAL LOW (ref 8.9–10.3)
Chloride: 107 mmol/L (ref 98–111)
Creatinine, Ser: 0.82 mg/dL (ref 0.61–1.24)
Glucose, Bld: 108 mg/dL — ABNORMAL HIGH (ref 70–99)
POTASSIUM: 4.2 mmol/L (ref 3.5–5.1)
SODIUM: 136 mmol/L (ref 135–145)

## 2018-05-05 LAB — MAGNESIUM
MAGNESIUM: 2 mg/dL (ref 1.7–2.4)
Magnesium: 1.9 mg/dL (ref 1.7–2.4)

## 2018-05-05 LAB — GLUCOSE, CAPILLARY
GLUCOSE-CAPILLARY: 101 mg/dL — AB (ref 70–99)
GLUCOSE-CAPILLARY: 111 mg/dL — AB (ref 70–99)
Glucose-Capillary: 104 mg/dL — ABNORMAL HIGH (ref 70–99)
Glucose-Capillary: 83 mg/dL (ref 70–99)

## 2018-05-05 MED ORDER — INSULIN ASPART 100 UNIT/ML ~~LOC~~ SOLN: 0.0000 [IU] | SUBCUTANEOUS | Status: DC

## 2018-05-05 MED ORDER — POTASSIUM CHLORIDE CRYS ER 20 MEQ PO TBCR
20.0000 meq | EXTENDED_RELEASE_TABLET | ORAL | Status: DC | PRN
  Administered 2018-05-05: 20 meq via ORAL
  Filled 2018-05-05: qty 1

## 2018-05-05 MED ORDER — ENOXAPARIN SODIUM 40 MG/0.4ML ~~LOC~~ SOLN
40.0000 mg | Freq: Every day | SUBCUTANEOUS | Status: DC
  Administered 2018-05-05: 40 mg via SUBCUTANEOUS
  Filled 2018-05-05: qty 0.4

## 2018-05-05 NOTE — Progress Notes (Signed)
Patient ID: Jerry Harper, male   DOB: 09/25/1990, 27 y.o.   MRN: [id number] EVENING ROUNDS NOTE :     [address]       [place] [id number]             [phone]                 1 Day Post-Op Procedure(s) (LRB): CORONARY ARTERY BYPASS GRAFTING (CABG) X ONE USING RIGHT INTERNAL MAMMARY ARTERY (N/A) TRANSESOPHAGEAL ECHOCARDIOGRAM (TEE) (N/A)  Total Length of Stay:  LOS: 1 day  BP 117/79   Pulse (!) 103   Temp 98.6 F (37 C)   Resp 18   Ht 5\' 11"  (1.803 m)   Wt 84.6 kg   SpO2 99%   BMI 26.01 kg/m   .Intake/Output      11/16 0701 - 11/17 0700   P.O. 475   I.V. (mL/kg) 220 (2.6)   Blood    IV Piggyback 100   Total Intake(mL/kg) 795 (9.4)   Urine (mL/kg/hr) 1100 (1)   Blood    Chest Tube 10   Total Output 1110   Net -315         . sodium chloride    . sodium chloride    . sodium chloride    . cefUROXime (ZINACEF)  IV 1.5 g (05/05/18 1059)  . dexmedetomidine (PRECEDEX) IV infusion 0.2 mcg/kg/hr (05/04/18 2018)  . lactated ringers    . lactated ringers    . lactated ringers 20 mL/hr at 05/04/18 2016  . nitroGLYCERIN Stopped (05/04/18 1903)  . phenylephrine (NEO-SYNEPHRINE) Adult infusion Stopped (05/04/18 1619)     Lab Results  Component Value Date   WBC 13.5 (H) 05/05/2018   HGB 11.9 (L) 05/05/2018   HCT 35.0 (L) 05/05/2018   PLT 190 05/05/2018   GLUCOSE 115 (H) 05/05/2018   ALT 16 04/30/2018   AST 17 04/30/2018   NA 138 05/05/2018   K 3.7 05/05/2018   CL 101 05/05/2018   CREATININE 0.90 05/05/2018   BUN 9 05/05/2018   CO2 23 05/05/2018   INR 1.33 05/04/2018   HGBA1C 4.4 (L) 04/30/2018   Stable day ambulating well    Delight OvensEdward B Harm Jou MD  Beeper [phone] Office [phone] 05/05/2018 7:40 PM

## 2018-05-05 NOTE — Plan of Care (Signed)
  Problem: Education: Goal: Knowledge of General Education information will improve Description Including pain rating scale, medication(s)/side effects and non-pharmacologic comfort measures Outcome: Progressing   Problem: Clinical Measurements: Goal: Respiratory complications will improve Outcome: Progressing Goal: Cardiovascular complication will be avoided Outcome: Progressing   Problem: Coping: Goal: Level of anxiety will decrease Outcome: Progressing   Problem: Elimination: Goal: Will not experience complications related to bowel motility Outcome: Progressing   Problem: Cardiac: Goal: Will achieve and/or maintain hemodynamic stability Outcome: Progressing

## 2018-05-05 NOTE — Progress Notes (Signed)
I responded to a page from the nurse to provide spiritual support for the patient. I visited the patient's room with his wife, mother, grandmother, aunt, and uncle present. I provided spiritual support by sharing words of encouragement and led in prayer. I shared that the Chaplain is available for additional support as needed or requested.     05/05/18 1730  Clinical Encounter Type  Visited With Patient and family together  Visit Type Spiritual support  Referral From Nurse  Consult/Referral To Chaplain  Spiritual Encounters  Spiritual Needs Prayer    Chaplain Dr Melvyn NovasMichael Lorri Fukuhara

## 2018-05-05 NOTE — Progress Notes (Signed)
Patient ID: Quintel Mccalla, male   DOB: 29-Jun-1990, 29 y.o.   MRN: [id number] TCTS DAILY ICU PROGRESS NOTE                   [address]            [name] [address]          [phone]   1 Day Post-Op Procedure(s) (LRB): CORONARY ARTERY BYPASS GRAFTING (CABG) X ONE USING RIGHT INTERNAL MAMMARY ARTERY (N/A) TRANSESOPHAGEAL ECHOCARDIOGRAM (TEE) (N/A)  Total Length of Stay:  LOS: 1 day   Subjective: Up to chair, awake alert pain control adequate  Objective: Vital signs in last 24 hours: Temp:  [97.2 F (36.2 C)-98.7 F (37.1 C)] 98.7 F (37.1 C) (11/16 0844) Pulse Rate:  [88-105] 105 (11/16 0800) Cardiac Rhythm: Sinus tachycardia;Normal sinus rhythm (11/16 0800) Resp:  [13-26] 21 (11/16 0800) BP: (98-120)/(63-81) 118/81 (11/16 0800) SpO2:  [96 %-100 %] 99 % (11/16 0800) Arterial Line BP: (91-125)/(55-72) 125/71 (11/16 0800) Weight:  [84.6 kg] 84.6 kg (11/16 0600)  Filed Weights   05/04/18 0549 05/05/18 0600  Weight: 82.1 kg 84.6 kg    Weight change: 2.495 kg   Hemodynamic parameters for last 24 hours: PAP: (19-27)/(13-20) 22/15 CO:  [5.4 L/min-7.3 L/min] 7.3 L/min CI:  [2.7 L/min/m2-3.6 L/min/m2] 3.6 L/min/m2  Intake/Output from previous day: 11/15 0701 - 11/16 0700 In: 4572.7 [I.V.:3603.3; Blood:450; IV Piggyback:519.4] Out: 5495 [Urine:4660; Blood:325; Chest Tube:510]  Intake/Output this shift: Total I/O In: 95 [P.O.:75; I.V.:20] Out: 150 [Urine:150]  Current Meds: Scheduled Meds: . acetaminophen  1,000 mg Oral Q6H   Or  . acetaminophen (TYLENOL) oral liquid 160 mg/5 mL  1,000 mg Per Tube Q6H  . aspirin EC  325 mg Oral Daily  . bisacodyl  10 mg Oral Daily   Or  . bisacodyl  10 mg Rectal Daily  . docusate sodium  200 mg Oral Daily  . insulin aspart  0-24 Units Subcutaneous Q4H  . ketorolac  15 mg Intravenous Q6H  . metoprolol tartrate  12.5 mg Oral BID  . [START ON 05/06/2018] pantoprazole  40 mg Oral Daily  . sodium chloride  flush  3 mL Intravenous Q12H   Continuous Infusions: . sodium chloride    . sodium chloride    . sodium chloride    . albumin human 12.5 g (05/04/18 1600)  . cefUROXime (ZINACEF)  IV 1.5 g (05/04/18 2216)  . dexmedetomidine (PRECEDEX) IV infusion 0.2 mcg/kg/hr (05/04/18 2018)  . famotidine (PEPCID) IV    . lactated ringers    . lactated ringers    . lactated ringers 20 mL/hr at 05/04/18 2016  . nitroGLYCERIN Stopped (05/04/18 1903)  . phenylephrine (NEO-SYNEPHRINE) Adult infusion Stopped (05/04/18 1619)   PRN Meds:.sodium chloride, albumin human, lactated ringers, metoprolol tartrate, midazolam, morphine injection, ondansetron (ZOFRAN) IV, oxyCODONE, sodium chloride flush, traMADol  General appearance: alert, cooperative and no distress Neurologic: intact Heart: regular rate and rhythm, S1, S2 normal, no murmur, click, rub or gallop Lungs: clear to auscultation bilaterally Abdomen: soft, non-tender; bowel sounds normal; no masses,  no organomegaly Extremities: extremities normal, atraumatic, no cyanosis or edema and Homans sign is negative, no sign of DVT Wound: Dressings intact  Lab Results: CBC: Recent Labs    05/04/18 1728 05/05/18 0335  WBC 11.1* 12.9*  HGB 11.3* 11.0*  HCT 32.8* 34.0*  PLT 154 187   BMET:  Recent Labs    05/04/18 1012 05/04/18 1142 05/04/18 1728 05/05/18 0335  NA 138 141  --  136  K 4.6 3.6  --  4.2  CL 104  --   --  107  CO2  --   --   --  23  GLUCOSE 142* 110*  --  108*  BUN 11  --   --  7  CREATININE 0.70  --  0.93 0.82  CALCIUM  --   --   --  8.6*    CMET: Lab Results  Component Value Date   WBC 12.9 (H) 05/05/2018   HGB 11.0 (L) 05/05/2018   HCT 34.0 (L) 05/05/2018   PLT 187 05/05/2018   GLUCOSE 108 (H) 05/05/2018   ALT 16 04/30/2018   AST 17 04/30/2018   NA 136 05/05/2018   K 4.2 05/05/2018   CL 107 05/05/2018   CREATININE 0.82 05/05/2018   BUN 7 05/05/2018   CO2 23 05/05/2018   INR 1.33 05/04/2018   HGBA1C 4.4 (L)  04/30/2018      PT/INR:  Recent Labs    05/04/18 1140  LABPROT 16.4*  INR 1.33   Radiology: Dg Chest Port 1 View  Result Date: 05/04/2018 CLINICAL DATA:  Anomalous right coronary artery, status post single-vessel bypass EXAM: PORTABLE CHEST 1 VIEW COMPARISON:  04/30/2018 FINDINGS: Swan-Ganz catheter is directed into the proximal right pulmonary artery via a right IJ approach. Mediastinal drains and right chest tube noted. Mild cardiac enlargement with central vascular congestion. Low lung volumes. No large effusion or pneumothorax. No focal collapse or consolidation. Trachea is midline. IMPRESSION: Low lung volumes with mild cardiac enlargement and vascular congestion. Support apparatus in good position. Electronically Signed   By: Judie PetitM.  Shick M.D.   On: 05/04/2018 12:08     Assessment/Plan: S/P Procedure(s) (LRB): CORONARY ARTERY BYPASS GRAFTING (CABG) X ONE USING RIGHT INTERNAL MAMMARY ARTERY (N/A) TRANSESOPHAGEAL ECHOCARDIOGRAM (TEE) (N/A) Mobilize DC lines and chest tube See progression orders Expected Acute  Blood - loss Anemia- continue to monitor      Delight Ovensdward B Cashawn Yanko 05/05/2018 9:18 AM

## 2018-05-06 ENCOUNTER — Other Ambulatory Visit: Payer: Self-pay

## 2018-05-06 ENCOUNTER — Inpatient Hospital Stay (HOSPITAL_COMMUNITY): Payer: Self-pay

## 2018-05-06 ENCOUNTER — Encounter (HOSPITAL_COMMUNITY): Payer: Self-pay | Admitting: Thoracic Surgery (Cardiothoracic Vascular Surgery)

## 2018-05-06 LAB — GLUCOSE, CAPILLARY
GLUCOSE-CAPILLARY: 87 mg/dL (ref 70–99)
Glucose-Capillary: 114 mg/dL — ABNORMAL HIGH (ref 70–99)
Glucose-Capillary: 84 mg/dL (ref 70–99)
Glucose-Capillary: 97 mg/dL (ref 70–99)
Glucose-Capillary: 98 mg/dL (ref 70–99)

## 2018-05-06 MED ORDER — BISACODYL 10 MG RE SUPP: 10.0000 mg | Freq: Every day | RECTAL | Status: DC | PRN

## 2018-05-06 MED ORDER — METOPROLOL TARTRATE 12.5 MG HALF TABLET
12.5000 mg | ORAL_TABLET | Freq: Two times a day (BID) | ORAL | Status: DC
  Administered 2018-05-06: 12.5 mg via ORAL
  Filled 2018-05-06: qty 1

## 2018-05-06 MED ORDER — OXYCODONE HCL 5 MG PO TABS
5.0000 mg | ORAL_TABLET | ORAL | Status: DC | PRN
  Administered 2018-05-06 – 2018-05-08 (×9): 10 mg via ORAL
  Filled 2018-05-06 (×9): qty 2

## 2018-05-06 MED ORDER — DOCUSATE SODIUM 100 MG PO CAPS
200.0000 mg | ORAL_CAPSULE | Freq: Every day | ORAL | Status: DC
  Administered 2018-05-06 – 2018-05-08 (×3): 200 mg via ORAL
  Filled 2018-05-06 (×2): qty 2

## 2018-05-06 MED ORDER — SODIUM CHLORIDE 0.9% FLUSH
3.0000 mL | Freq: Two times a day (BID) | INTRAVENOUS | Status: DC
  Administered 2018-05-06 – 2018-05-08 (×5): 3 mL via INTRAVENOUS

## 2018-05-06 MED ORDER — ALUM & MAG HYDROXIDE-SIMETH 200-200-20 MG/5ML PO SUSP: 15.0000 mL | ORAL | Status: DC | PRN

## 2018-05-06 MED ORDER — ENOXAPARIN SODIUM 40 MG/0.4ML ~~LOC~~ SOLN
40.0000 mg | SUBCUTANEOUS | Status: DC
  Administered 2018-05-06 – 2018-05-07 (×2): 40 mg via SUBCUTANEOUS
  Filled 2018-05-06 (×2): qty 0.4

## 2018-05-06 MED ORDER — ONDANSETRON HCL 4 MG/2ML IJ SOLN
4.0000 mg | Freq: Four times a day (QID) | INTRAMUSCULAR | Status: DC | PRN
  Administered 2018-05-06: 4 mg via INTRAVENOUS
  Filled 2018-05-06: qty 2

## 2018-05-06 MED ORDER — SODIUM CHLORIDE 0.9 % IV SOLN: 250.0000 mL | INTRAVENOUS | Status: DC | PRN

## 2018-05-06 MED ORDER — MOVING RIGHT ALONG BOOK
Freq: Once | Status: AC
  Administered 2018-05-06: 1
  Filled 2018-05-06: qty 1

## 2018-05-06 MED ORDER — BISACODYL 5 MG PO TBEC
10.0000 mg | DELAYED_RELEASE_TABLET | Freq: Every day | ORAL | Status: DC | PRN
  Administered 2018-05-07: 10 mg via ORAL
  Filled 2018-05-06: qty 2

## 2018-05-06 MED ORDER — ASPIRIN EC 81 MG PO TBEC
81.0000 mg | DELAYED_RELEASE_TABLET | Freq: Every day | ORAL | Status: DC
  Administered 2018-05-07 – 2018-05-08 (×2): 81 mg via ORAL
  Filled 2018-05-06 (×2): qty 1

## 2018-05-06 MED ORDER — DOCUSATE SODIUM 100 MG PO CAPS: 100.0000 mg | ORAL_CAPSULE | Freq: Two times a day (BID) | ORAL | Status: DC | PRN

## 2018-05-06 MED ORDER — TRAMADOL HCL 50 MG PO TABS
50.0000 mg | ORAL_TABLET | ORAL | Status: DC | PRN
  Administered 2018-05-07: 100 mg via ORAL
  Filled 2018-05-06: qty 2

## 2018-05-06 MED ORDER — ACETAMINOPHEN 325 MG PO TABS
650.0000 mg | ORAL_TABLET | Freq: Four times a day (QID) | ORAL | Status: DC | PRN
  Administered 2018-05-07 (×2): 650 mg via ORAL
  Filled 2018-05-06 (×2): qty 2

## 2018-05-06 MED ORDER — SODIUM CHLORIDE 0.9% FLUSH: 3.0000 mL | INTRAVENOUS | Status: DC | PRN

## 2018-05-06 MED ORDER — ONDANSETRON HCL 4 MG PO TABS: 4.0000 mg | ORAL_TABLET | Freq: Four times a day (QID) | ORAL | Status: DC | PRN

## 2018-05-06 NOTE — Plan of Care (Signed)

## 2018-05-06 NOTE — Progress Notes (Signed)
Patient was provided with orange jello in attempt to see if PO's can be tolerated so that pain medication can be given for chest tube removal.

## 2018-05-06 NOTE — Progress Notes (Signed)
Patient ID: Jerry Harper, male   DOB: February 24, 1991, 27 y.o.   MRN: [id number] TCTS DAILY ICU PROGRESS NOTE                   [address]            [place] [id number]          [phone]   2 Days Post-Op Procedure(s) (LRB): CORONARY ARTERY BYPASS GRAFTING (CABG) X ONE USING RIGHT INTERNAL MAMMARY ARTERY (N/A) TRANSESOPHAGEAL ECHOCARDIOGRAM (TEE) (N/A)  Total Length of Stay:  LOS: 2 days   Subjective: Patient feels well this morning  Objective: Vital signs in last 24 hours: Temp:  [98.4 F (36.9 C)-99.5 F (37.5 C)] 98.7 F (37.1 C) (11/17 0900) Pulse Rate:  [93-115] 104 (11/17 0700) Cardiac Rhythm: Sinus tachycardia (11/17 0400) Resp:  [14-23] 18 (11/17 0700) BP: (103-129)/(63-87) 120/83 (11/17 0700) SpO2:  [96 %-100 %] 99 % (11/17 0700) Weight:  [82.7 kg] 82.7 kg (11/17 0500)  Filed Weights   05/04/18 0549 05/05/18 0600 05/06/18 0500  Weight: 82.1 kg 84.6 kg 82.7 kg    Weight change: -1.896 kg   Hemodynamic parameters for last 24 hours:    Intake/Output from previous day: 11/16 0701 - 11/17 0700 In: 1475.3 [P.O.:475; I.V.:700.4; IV Piggyback:299.9] Out: 2060 [Urine:2050; Chest Tube:10]  Intake/Output this shift: No intake/output data recorded.  Current Meds: Scheduled Meds: . acetaminophen  1,000 mg Oral Q6H   Or  . acetaminophen (TYLENOL) oral liquid 160 mg/5 mL  1,000 mg Per Tube Q6H  . aspirin EC  325 mg Oral Daily  . bisacodyl  10 mg Oral Daily   Or  . bisacodyl  10 mg Rectal Daily  . docusate sodium  200 mg Oral Daily  . enoxaparin (LOVENOX) injection  40 mg Subcutaneous QHS  . insulin aspart  0-24 Units Subcutaneous Q4H  . metoprolol tartrate  12.5 mg Oral BID  . pantoprazole  40 mg Oral Daily  . sodium chloride flush  3 mL Intravenous Q12H   Continuous Infusions: . sodium chloride    . sodium chloride    . sodium chloride    . cefUROXime (ZINACEF)  IV Stopped (05/05/18 2210)  . dexmedetomidine (PRECEDEX) IV infusion  Stopped (05/05/18 0219)  . lactated ringers    . lactated ringers    . lactated ringers 20 mL/hr at 05/06/18 0700  . nitroGLYCERIN Stopped (05/04/18 1903)  . phenylephrine (NEO-SYNEPHRINE) Adult infusion Stopped (05/04/18 1619)   PRN Meds:.sodium chloride, lactated ringers, metoprolol tartrate, midazolam, morphine injection, ondansetron (ZOFRAN) IV, oxyCODONE, potassium chloride, sodium chloride flush, traMADol  General appearance: alert and cooperative Neurologic: intact Heart: regular rate and rhythm, S1, S2 normal, no murmur, click, rub or gallop Lungs: clear to auscultation bilaterally Abdomen: soft, non-tender; bowel sounds normal; no masses,  no organomegaly Extremities: extremities normal, atraumatic, no cyanosis or edema and Homans sign is negative, no sign of DVT Wound: Dressing intact sternum stable  Lab Results: CBC: Recent Labs    05/05/18 0335 05/05/18 1622 05/05/18 1623  WBC 12.9* 13.5*  --   HGB 11.0* 11.4* 11.9*  HCT 34.0* 34.8* 35.0*  PLT 187 190  --    BMET:  Recent Labs    05/05/18 0335 05/05/18 1622 05/05/18 1623  NA 136  --  138  K 4.2  --  3.7  CL 107  --  101  CO2 23  --   --   GLUCOSE 108*  --  115*  BUN  7  --  9  CREATININE 0.82 0.98 0.90  CALCIUM 8.6*  --   --     CMET: Lab Results  Component Value Date   WBC 13.5 (H) 05/05/2018   HGB 11.9 (L) 05/05/2018   HCT 35.0 (L) 05/05/2018   PLT 190 05/05/2018   GLUCOSE 115 (H) 05/05/2018   ALT 16 04/30/2018   AST 17 04/30/2018   NA 138 05/05/2018   K 3.7 05/05/2018   CL 101 05/05/2018   CREATININE 0.90 05/05/2018   BUN 9 05/05/2018   CO2 23 05/05/2018   INR 1.33 05/04/2018   HGBA1C 4.4 (L) 04/30/2018      PT/INR:  Recent Labs    05/04/18 1140  LABPROT 16.4*  INR 1.33   Radiology: Dg Chest Port 1 View  Result Date: 05/06/2018 CLINICAL DATA:  Postop EXAM: PORTABLE CHEST 1 VIEW COMPARISON:  05/05/2018 FINDINGS: Lungs are clear.  No pleural effusion or pneumothorax. Mild  cardiomegaly.  Postsurgical changes related to prior CABG. Inferior approach Swan-Ganz catheter terminates in the main pulmonary artery. Right IJ venous sheath. Interval removal of right chest tube and mediastinal drain. Median sternotomy. IMPRESSION: Interval removal of right chest tube and mediastinal drain. No pneumothorax is seen. Additional support apparatus as above. No pneumothorax. Electronically Signed   By: Charline Bills M.D.   On: 05/06/2018 08:45     Assessment/Plan: S/P Procedure(s) (LRB): CORONARY ARTERY BYPASS GRAFTING (CABG) X ONE USING RIGHT INTERNAL MAMMARY ARTERY (N/A) TRANSESOPHAGEAL ECHOCARDIOGRAM (TEE) (N/A) Mobilize Plan for transfer to step-down: see transfer orders DC remaining tubes Renal function and hemoglobin stable   Delight Ovens 05/06/2018 9:18 AM

## 2018-05-06 NOTE — Progress Notes (Signed)
Patient ID: Jerry Harper, male   DOB: [birth year], 27 y.o.   MRN: [id number] EVENING ROUNDS NOTE :     [address]       [place] [id number]             [phone]                 2 Days Post-Op Procedure(s) (LRB): CORONARY ARTERY BYPASS GRAFTING (CABG) X ONE USING RIGHT INTERNAL MAMMARY ARTERY (N/A) TRANSESOPHAGEAL ECHOCARDIOGRAM (TEE) (N/A)  Total Length of Stay:  LOS: 2 days  BP 109/83   Pulse (!) 108   Temp 98.3 F (36.8 C) (Oral)   Resp (!) 21   Ht 5\' 11"  (1.803 m)   Wt 82.7 kg   SpO2 100%   BMI 25.43 kg/m   .Intake/Output      11/16 0701 - 11/17 0700 11/17 0701 - 11/18 0700   P.O. 475 960   I.V. (mL/kg) 700.4 (8.5) 49.9 (0.6)   Blood     IV Piggyback 299.9 100   Total Intake(mL/kg) 1475.3 (17.8) 1109.9 (13.4)   Urine (mL/kg/hr) 2050 (1) 1205 (1.2)   Blood     Chest Tube 10 54   Total Output 2060 1259   Net -584.7 -149.1          . sodium chloride       Lab Results  Component Value Date   WBC 13.5 (H) 05/05/2018   HGB 11.9 (L) 05/05/2018   HCT 35.0 (L) 05/05/2018   PLT 190 05/05/2018   GLUCOSE 115 (H) 05/05/2018   ALT 16 04/30/2018   AST 17 04/30/2018   NA 138 05/05/2018   K 3.7 05/05/2018   CL 101 05/05/2018   CREATININE 0.90 05/05/2018   BUN 9 05/05/2018   CO2 23 05/05/2018   INR 1.33 05/04/2018   HGBA1C 4.4 (L) 04/30/2018   Stable day  Waiting for 4e bed   Delight OvensEdward B Mouhamad Teed MD  Beeper [phone] Office [phone] 05/06/2018 6:41 PM

## 2018-05-06 NOTE — Progress Notes (Signed)
Patient's mother came out of room asking for "5 packs of salt."  Asked if this was was for her or the patient, she replied for my son".  Patient and family were educated that the patient is on a heart healthy /carb diet and salt is not allowed on cardiac patient. Patient and family agreed and verbalized understanding.  Spouse ordered 2 bottles of Dasani water as patient does not water from the unit.

## 2018-05-06 NOTE — Progress Notes (Signed)
Patient denies nausea and was able to eat Jello w/o vomiting.  Introducer removed as ordered per protocol.  [facility] with PO pains to remove remaining chest tube.

## 2018-05-06 NOTE — Progress Notes (Signed)
Patient assisted back to bed.  Gait steady, contact guard safety of invasive tubes and monitoring cables.  After being in the bed for 10 minutes patient began to complain of dizziness and then nausea.  Patient did not eat anything for clear liquid breakfast except sprite.  [facility].  Will pull central line and then chest tubes when nausea has passed.

## 2018-05-06 NOTE — Progress Notes (Signed)
SW received consult for patient due to lack of medical insurance and prescription coverage. SW met with patient and wife at bedside to discuss concerns. Patient's wife, Marisue Brooklyn informed SW that patient has been out of work for the past year due to his cardiac issues. Marisue Brooklyn informed SW that she is employed as a Technical brewer and that the couple's 29 year old daughter has Medicaid and Center. Marisue Brooklyn reported that she has been going without food due to financial hardships during the past year. Patient, his wife, and daughter reside in [place]. SW gave brief information on how to access Emergency Medicaid services and to talk with a Development worker, community prior to being discharged.   SW gave patient's wife two meal vouchers to utilize during stay at [facility].  Madilyn Fireman, MSW Medical Social Worker [place]

## 2018-05-07 ENCOUNTER — Inpatient Hospital Stay (HOSPITAL_COMMUNITY): Payer: Self-pay

## 2018-05-07 LAB — URINALYSIS, ROUTINE W REFLEX MICROSCOPIC
Bilirubin Urine: NEGATIVE
GLUCOSE, UA: NEGATIVE mg/dL
Hgb urine dipstick: NEGATIVE
Ketones, ur: 20 mg/dL — AB
LEUKOCYTES UA: NEGATIVE
Nitrite: NEGATIVE
PH: 7 (ref 5.0–8.0)
PROTEIN: NEGATIVE mg/dL
SPECIFIC GRAVITY, URINE: 1.013 (ref 1.005–1.030)

## 2018-05-07 LAB — BASIC METABOLIC PANEL
Anion gap: 10 (ref 5–15)
BUN: 8 mg/dL (ref 6–20)
CO2: 27 mmol/L (ref 22–32)
Calcium: 9 mg/dL (ref 8.9–10.3)
Chloride: 101 mmol/L (ref 98–111)
Creatinine, Ser: 1.11 mg/dL (ref 0.61–1.24)
GFR calc Af Amer: >60 mL/min (ref >60)
GFR calc non Af Amer: >60 mL/min (ref >60)
Glucose, Bld: 100 mg/dL — ABNORMAL HIGH (ref 70–99)
Potassium: 3.5 mmol/L (ref 3.5–5.1)
Sodium: 138 mmol/L (ref 135–145)

## 2018-05-07 LAB — GLUCOSE, CAPILLARY: Glucose-Capillary: 107 mg/dL — ABNORMAL HIGH (ref 70–99)

## 2018-05-07 LAB — CBC
HCT: 37.3 % — ABNORMAL LOW (ref 39.0–52.0)
Hemoglobin: 12.6 g/dL — ABNORMAL LOW (ref 13.0–17.0)
MCH: 30.6 pg (ref 26.0–34.0)
MCHC: 33.8 g/dL (ref 30.0–36.0)
MCV: 90.5 fL (ref 80.0–100.0)
Platelets: 211 10*3/uL (ref 150–400)
RBC: 4.12 MIL/uL — ABNORMAL LOW (ref 4.22–5.81)
RDW: 11.7 % (ref 11.5–15.5)
WBC: 12 10*3/uL — ABNORMAL HIGH (ref 4.0–10.5)
nRBC: 0 % (ref 0.0–0.2)

## 2018-05-07 MED ORDER — POTASSIUM CHLORIDE CRYS ER 20 MEQ PO TBCR
40.0000 meq | EXTENDED_RELEASE_TABLET | Freq: Once | ORAL | Status: AC
  Administered 2018-05-07: 40 meq via ORAL
  Filled 2018-05-07: qty 2

## 2018-05-07 MED ORDER — METOPROLOL TARTRATE 25 MG PO TABS
25.0000 mg | ORAL_TABLET | Freq: Two times a day (BID) | ORAL | Status: DC
  Administered 2018-05-07 (×2): 25 mg via ORAL
  Filled 2018-05-07 (×2): qty 1

## 2018-05-07 MED ORDER — MAGNESIUM OXIDE 400 (241.3 MG) MG PO TABS
400.0000 mg | ORAL_TABLET | Freq: Two times a day (BID) | ORAL | Status: DC
  Administered 2018-05-07 – 2018-05-08 (×3): 400 mg via ORAL
  Filled 2018-05-07 (×3): qty 1

## 2018-05-07 NOTE — Plan of Care (Signed)
  Problem: Education: Goal: Knowledge of General Education information will improve Description Including pain rating scale, medication(s)/side effects and non-pharmacologic comfort measures Outcome: Progressing   Problem: Health Behavior/Discharge Planning: Goal: Ability to manage health-related needs will improve Outcome: Progressing   Problem: Clinical Measurements: Goal: Ability to maintain clinical measurements within normal limits will improve Outcome: Progressing Goal: Will remain free from infection Outcome: Progressing Goal: Respiratory complications will improve Outcome: Progressing Goal: Cardiovascular complication will be avoided Outcome: Progressing   Problem: Activity: Goal: Risk for activity intolerance will decrease Outcome: Progressing   Problem: Nutrition: Goal: Adequate nutrition will be maintained Outcome: Progressing   Problem: Coping: Goal: Level of anxiety will decrease Outcome: Progressing   Problem: Elimination: Goal: Will not experience complications related to urinary retention Outcome: Progressing   Problem: Pain Managment: Goal: General experience of comfort will improve Outcome: Progressing   Problem: Safety: Goal: Ability to remain free from injury will improve Outcome: Progressing   Problem: Skin Integrity: Goal: Risk for impaired skin integrity will decrease Outcome: Progressing   Problem: Education: Goal: Will demonstrate proper wound care and an understanding of methods to prevent future damage Outcome: Progressing Goal: Knowledge of disease or condition will improve Outcome: Progressing Goal: Knowledge of the prescribed therapeutic regimen will improve Outcome: Progressing Goal: Individualized Educational Video(s) Outcome: Progressing   Problem: Activity: Goal: Risk for activity intolerance will decrease Outcome: Progressing   Problem: Cardiac: Goal: Will achieve and/or maintain hemodynamic stability Outcome:  Progressing   Problem: Clinical Measurements: Goal: Postoperative complications will be avoided or minimized Outcome: Progressing   Problem: Respiratory: Goal: Respiratory status will improve Outcome: Progressing   Problem: Skin Integrity: Goal: Wound healing without signs and symptoms of infection Outcome: Progressing Goal: Risk for impaired skin integrity will decrease Outcome: Progressing   Problem: Urinary Elimination: Goal: Ability to achieve and maintain adequate renal perfusion and functioning will improve Outcome: Progressing

## 2018-05-07 NOTE — Progress Notes (Signed)
Pt transferred from 2H to 4east room 3. Tele box MX40-2 placed CCMD notified. CHG bath given. VSS. Pt requests all 4 bed rails to be up. Call bell within reach. Pt is resting. Will continue to monitor.  Judithann SheenJuhi Annlee Glandon, RN

## 2018-05-07 NOTE — Progress Notes (Addendum)
[address]       [place] [address]             [phone]      3 Days Post-Op Procedure(s) (LRB): CORONARY ARTERY BYPASS GRAFTING (CABG) X ONE USING RIGHT INTERNAL MAMMARY ARTERY (N/A) TRANSESOPHAGEAL ECHOCARDIOGRAM (TEE) (N/A) Subjective: Some incisional discomfort   Objective: Vital signs in last 24 hours: Temp:  [98.3 F (36.8 C)-102.6 F (39.2 C)] 99 F (37.2 C) (11/18 0418) Pulse Rate:  [98-130] 123 (11/18 0700) Cardiac Rhythm: Sinus tachycardia (11/18 0400) Resp:  [14-34] 23 (11/18 0700) BP: (104-147)/(71-105) 116/77 (11/18 0700) SpO2:  [96 %-100 %] 97 % (11/18 0700) Weight:  [80.3 kg] 80.3 kg (11/18 0500)  Hemodynamic parameters for last 24 hours:    Intake/Output from previous day: 11/17 0701 - 11/18 0700 In: 1229.9 [P.O.:1080; I.V.:49.9; IV Piggyback:100] Out: 1809 [Urine:1755; Chest Tube:54] Intake/Output this shift: No intake/output data recorded.  General appearance: alert, cooperative and no distress Heart: regular rate and rhythm and tachy Lungs: clear to auscultation bilaterally Abdomen: benign exam Extremities: no edema Wound: incis healing well  Lab Results: Recent Labs    05/05/18 1622 05/05/18 1623 05/07/18 0303  WBC 13.5*  --  12.0*  HGB 11.4* 11.9* 12.6*  HCT 34.8* 35.0* 37.3*  PLT 190  --  211   BMET:  Recent Labs    05/05/18 0335  05/05/18 1623 05/07/18 0303  NA 136  --  138 138  K 4.2  --  3.7 3.5  CL 107  --  101 101  CO2 23  --   --  27  GLUCOSE 108*  --  115* 100*  BUN 7  --  9 8  CREATININE 0.82   < > 0.90 1.11  CALCIUM 8.6*  --   --  9.0   < > = values in this interval not displayed.    PT/INR:  Recent Labs    05/04/18 1140  LABPROT 16.4*  INR 1.33   ABG    Component Value Date/Time   PHART 7.309 (L) 05/04/2018 1235   HCO3 24.8 05/04/2018 1235   TCO2 28 05/05/2018 1623   ACIDBASEDEF 2.0 05/04/2018 1235   O2SAT 100.0 05/04/2018 1235   CBG (last 3)  Recent Labs     05/06/18 0749 05/06/18 1200 05/06/18 1600  GLUCAP 98 87 114*    Meds Scheduled Meds: . aspirin EC  81 mg Oral Daily  . docusate sodium  200 mg Oral Daily  . enoxaparin (LOVENOX) injection  40 mg Subcutaneous Q24H  . metoprolol tartrate  12.5 mg Oral BID  . sodium chloride flush  3 mL Intravenous Q12H   Continuous Infusions: . sodium chloride     PRN Meds:.sodium chloride, acetaminophen, alum & mag hydroxide-simeth, bisacodyl **OR** bisacodyl, docusate sodium, ondansetron **OR** ondansetron (ZOFRAN) IV, oxyCODONE, sodium chloride flush, traMADol  Xrays Dg Chest Port 1 View  Result Date: 05/06/2018 CLINICAL DATA:  Postop EXAM: PORTABLE CHEST 1 VIEW COMPARISON:  05/05/2018 FINDINGS: Lungs are clear.  No pleural effusion or pneumothorax. Mild cardiomegaly.  Postsurgical changes related to prior CABG. Inferior approach Swan-Ganz catheter terminates in the main pulmonary artery. Right IJ venous sheath. Interval removal of right chest tube and mediastinal drain. Median sternotomy. IMPRESSION: Interval removal of right chest tube and mediastinal drain. No pneumothorax is seen. Additional support apparatus as above. No pneumothorax. Electronically Signed   By: Charline Bills M.D.   On: 05/06/2018 08:45    Assessment/Plan:  S/P Procedure(s) (LRB): CORONARY ARTERY BYPASS GRAFTING (CABG) X ONE USING RIGHT INTERNAL MAMMARY ARTERY (N/A) TRANSESOPHAGEAL ECHOCARDIOGRAM (TEE) (N/A)  1 doing well POPD#3 CABGx 1 2 febrile Tmax 102.6- CXR pending from this am - has been ordered, check UA as well 3 Tachycardia 130's , could be related to fever status- increase beta blocker dose, replace K+/Magnesium 4 minor leukocytosis with improving trend 5 very minor anemia 6 normal renal function without significant volume overload 7 BS well controlled 8 O2 sats good on RA- push pulm toilet for clinical atx 9 increase mobilization as able   LOS: 3 days    Rowe ClackWayne E Gold [facility] 05/07/2018 Pager [phone]  [phone]  I have seen and examined the patient and agree with the assessment and plan as outlined.  Fever likely due to atelectasis.  Needs encouragement to mobilize.  Transfer 4E.  Purcell Nailslarence H Hevin Jeffcoat, MD 05/07/2018 9:32 AM

## 2018-05-07 NOTE — Social Work (Signed)
CSW acknowledging consult, RN Case Manager has seen pt for assistance with medication and PCP. Pt has been seen by financial counseling for assistance with Medicaid. [place] [facility] [place] [facility] per financial counseling notes.  CSW signing off. Please consult if any additional needs arise.  Doy HutchingIsabel H Jamela Cumbo, LCSWA [facility] Clinical Social Work [phone]

## 2018-05-07 NOTE — Care Management Note (Addendum)
Case Management Note  Patient Details  Name: Jerry Harper MRN: [id number] Date of Birth: [birth year]  Subjective/Objective:   27yo male s/p CABG.                Action/Plan: CM met with patient to discuss transitional care needs. Patient lived at home with family, independent with ADLs PTA. Patient verified having no local established PCP nor active health insurance, but agreeable to [facility] f/u appointment arranged at: [facility] on 05/14/18 @ 0830; AVS updated with appt details. Patient agreeable to [place] service; CM team will also screen Rx for MATCH consideration; please order the lowest cost medications. Patient's family will provide assistance post transition and transportation home. Discharging CM Fax H/P, DC summary, med list to [place] @ [phone]. CM team will continue to follow.   Expected Discharge Date:                  Expected Discharge Plan:  Home/Self Care  In-House Referral:  Financial Counselor, Clinical Social Work  Discharge planning Services  CM Consult, [facility], Medication Assistance  Post Acute Care Choice:  NA Choice offered to:  NA  DME Arranged:  N/A DME Agency:  NA  HH Arranged:  NA HH Agency:  NA  Status of Service:  In process, will continue to follow  If discussed at Long Length of Stay Meetings, dates discussed:    Additional Comments:  Midge Minium RN, BSN, NCM-BC, ACM-RN [phone] 05/07/2018, 12:39 PM

## 2018-05-08 ENCOUNTER — Inpatient Hospital Stay (HOSPITAL_COMMUNITY): Payer: Self-pay

## 2018-05-08 DIAGNOSIS — R0602 Shortness of breath: Secondary | ICD-10-CM

## 2018-05-08 LAB — CBC WITH DIFFERENTIAL/PLATELET
ABS IMMATURE GRANULOCYTES: 0.07 10*3/uL (ref 0.00–0.07)
BASOS PCT: 0 %
Basophils Absolute: 0 10*3/uL (ref 0.0–0.1)
EOS ABS: 0 10*3/uL (ref 0.0–0.5)
Eosinophils Relative: 0 %
HCT: 37.4 % — ABNORMAL LOW (ref 39.0–52.0)
Hemoglobin: 12.9 g/dL — ABNORMAL LOW (ref 13.0–17.0)
IMMATURE GRANULOCYTES: 1 %
Lymphocytes Relative: 12 %
Lymphs Abs: 1.5 10*3/uL (ref 0.7–4.0)
MCH: 30.6 pg (ref 26.0–34.0)
MCHC: 34.5 g/dL (ref 30.0–36.0)
MCV: 88.8 fL (ref 80.0–100.0)
MONOS PCT: 11 %
Monocytes Absolute: 1.4 10*3/uL — ABNORMAL HIGH (ref 0.1–1.0)
NEUTROS PCT: 76 %
Neutro Abs: 9.9 10*3/uL — ABNORMAL HIGH (ref 1.7–7.7)
PLATELETS: 229 10*3/uL (ref 150–400)
RBC: 4.21 MIL/uL — AB (ref 4.22–5.81)
RDW: 11.5 % (ref 11.5–15.5)
WBC: 12.9 10*3/uL — AB (ref 4.0–10.5)
nRBC: 0 % (ref 0.0–0.2)

## 2018-05-08 LAB — ECHOCARDIOGRAM COMPLETE
HEIGHTINCHES: 71 in
Weight: 2779.56 oz

## 2018-05-08 MED ORDER — METOPROLOL TARTRATE 50 MG PO TABS: 50.0000 mg | ORAL_TABLET | Freq: Two times a day (BID) | ORAL | 1 refills | Status: DC

## 2018-05-08 MED ORDER — ACETAMINOPHEN 325 MG PO TABS: 650.0000 mg | ORAL_TABLET | Freq: Four times a day (QID) | ORAL | Status: DC | PRN

## 2018-05-08 MED ORDER — METOPROLOL TARTRATE 50 MG PO TABS
50.0000 mg | ORAL_TABLET | Freq: Two times a day (BID) | ORAL | Status: DC
  Administered 2018-05-08: 50 mg via ORAL
  Filled 2018-05-08: qty 1

## 2018-05-08 MED ORDER — OXYCODONE HCL 5 MG PO TABS: 5.0000 mg | ORAL_TABLET | Freq: Four times a day (QID) | ORAL | 0 refills | Status: AC | PRN

## 2018-05-08 MED ORDER — ASPIRIN 81 MG PO TBEC: 81.0000 mg | DELAYED_RELEASE_TABLET | Freq: Every day | ORAL | Status: DC

## 2018-05-08 MED FILL — Mannitol IV Soln 20%: INTRAVENOUS | Qty: 500 | Status: AC

## 2018-05-08 MED FILL — Vancomycin HCl For IV Soln 1 GM (Base Equivalent): INTRAVENOUS | Qty: 1000 | Status: AC

## 2018-05-08 MED FILL — Heparin Sodium (Porcine) Inj 1000 Unit/ML: INTRAMUSCULAR | Qty: 30 | Status: AC

## 2018-05-08 MED FILL — Heparin Sodium (Porcine) Inj 1000 Unit/ML: INTRAMUSCULAR | Qty: 10 | Status: AC

## 2018-05-08 MED FILL — Lidocaine HCl(Cardiac) IV PF Soln Pref Syr 100 MG/5ML (2%): INTRAVENOUS | Qty: 5 | Status: AC

## 2018-05-08 MED FILL — Sodium Bicarbonate IV Soln 8.4%: INTRAVENOUS | Qty: 50 | Status: AC

## 2018-05-08 MED FILL — Sodium Chloride IV Soln 0.9%: INTRAVENOUS | Qty: 2000 | Status: AC

## 2018-05-08 MED FILL — Potassium Chloride Inj 2 mEq/ML: INTRAVENOUS | Qty: 40 | Status: AC

## 2018-05-08 MED FILL — Electrolyte-R (PH 7.4) Solution: INTRAVENOUS | Qty: 3000 | Status: AC

## 2018-05-08 MED FILL — Magnesium Sulfate Inj 50%: INTRAMUSCULAR | Qty: 10 | Status: AC

## 2018-05-08 NOTE — Care Management Note (Signed)
Case Management Note Initial CM note completed by Midge Minium RN, BSN, NCM-BC, ACM-RN [phone] 05/07/2018, 12:39 PM  Patient Details  Name: Jerry Harper MRN: [id number] Date of Birth: 01-18-1991  Subjective/Objective:   27yo male s/p CABG.                Action/Plan: CM met with patient to discuss transitional care needs. Patient lived at home with family, independent with ADLs PTA. Patient verified having no local established PCP nor active health insurance, but agreeable to [facility] f/u appointment arranged at: [facility] Department on 05/14/18 @ 0830; AVS updated with appt details. Patient agreeable to Gantt service; CM team will also screen Rx for MATCH consideration; please order the lowest cost medications. Patient's family will provide assistance post transition and transportation home. Discharging CM Fax H/P, DC summary, med list to [place] @ [phone]. CM team will continue to follow.   Expected Discharge Date:  05/08/18               Expected Discharge Plan:  Home/Self Care  In-House Referral:  Financial Counselor, Clinical Social Work  Discharge planning Services  CM Consult, [facility], Medication Assistance  Post Acute Care Choice:  NA Choice offered to:  NA  DME Arranged:  N/A DME Agency:  NA  HH Arranged:  NA HH Agency:  NA  Status of Service:  Completed, signed off  If discussed at [employer] of Stay Meetings, dates discussed:    Discharge Disposition: home/self care   Additional Comments:  05/08/18- 1355- Jerry Gibbons RN, CM- pt for transition home today, previous CM has set pt up with PCP, discharge medications reviewed and no need for [facility] assistance. Pt provided with printed scripts. No further CM needs noted for transition home.   Jerry Gibbons RN, BSN Transitions of Care Unit 4E- RN Case Manager [phone] 05/08/2018, 1:53 PM

## 2018-05-08 NOTE — Progress Notes (Addendum)
[address]       [place] [address]             [phone]      4 Days Post-Op Procedure(s) (LRB): CORONARY ARTERY BYPASS GRAFTING (CABG) X ONE USING RIGHT INTERNAL MAMMARY ARTERY (N/A) TRANSESOPHAGEAL ECHOCARDIOGRAM (TEE) (N/A) Subjective: Feels well overall, HR control remains a little bit of an issue upto 140-150's  Objective: Vital signs in last 24 hours: Temp:  [98.3 F (36.8 C)-99.7 F (37.6 C)] 98.3 F (36.8 C) (11/19 0503) Pulse Rate:  [122-132] 122 (11/19 0503) Cardiac Rhythm: Sinus tachycardia (11/18 2354) Resp:  [18] 18 (11/19 0503) BP: (103-122)/(78-91) 111/89 (11/19 0503) SpO2:  [95 %-97 %] 97 % (11/19 0503) Weight:  [78.8 kg] 78.8 kg (11/19 0503)  Hemodynamic parameters for last 24 hours:    Intake/Output from previous day: 11/18 0701 - 11/19 0700 In: 480 [P.O.:480] Out: 500 [Urine:500] Intake/Output this shift: No intake/output data recorded.  General appearance: alert, cooperative and no distress Heart: regular rate and rhythm and tachy Lungs: clear to auscultation bilaterally Abdomen: benign Extremities: no edema Wound: incis healing well  Lab Results: Recent Labs    05/07/18 0303 05/08/18 0318  WBC 12.0* 12.9*  HGB 12.6* 12.9*  HCT 37.3* 37.4*  PLT 211 229   BMET:  Recent Labs    05/05/18 1623 05/07/18 0303  NA 138 138  K 3.7 3.5  CL 101 101  CO2  --  27  GLUCOSE 115* 100*  BUN 9 8  CREATININE 0.90 1.11  CALCIUM  --  9.0    PT/INR: No results for input(s): LABPROT, INR in the last 72 hours. ABG    Component Value Date/Time   PHART 7.309 (L) 05/04/2018 1235   HCO3 24.8 05/04/2018 1235   TCO2 28 05/05/2018 1623   ACIDBASEDEF 2.0 05/04/2018 1235   O2SAT 100.0 05/04/2018 1235   CBG (last 3)  Recent Labs    05/06/18 0749 05/06/18 1200 05/06/18 1600  GLUCAP 98 87 114*    Meds Scheduled Meds: . aspirin EC  81 mg Oral Daily  . docusate sodium  200 mg Oral Daily  . enoxaparin (LOVENOX)  injection  40 mg Subcutaneous Q24H  . magnesium oxide  400 mg Oral BID  . metoprolol tartrate  25 mg Oral BID  . sodium chloride flush  3 mL Intravenous Q12H   Continuous Infusions: . sodium chloride     PRN Meds:.sodium chloride, acetaminophen, alum & mag hydroxide-simeth, bisacodyl **OR** bisacodyl, docusate sodium, ondansetron **OR** ondansetron (ZOFRAN) IV, oxyCODONE, sodium chloride flush, traMADol  Xrays Dg Chest 2 View  Result Date: 05/07/2018 CLINICAL DATA:  Status post coronary artery bypass grafting. Chest pain EXAM: CHEST - 2 VIEW COMPARISON:  January 03, 2018 FINDINGS: Temporary pacemaker wires are attached to the right heart. There is a persistent rather minimal pneumothorax on the right without tension component. There is slight right base atelectasis. The lungs elsewhere are clear. Heart is upper normal in size with pulmonary vascularity normal. No adenopathy. Postoperative changes noted. IMPRESSION: Small persistent right apical pneumothorax without tension component. Mild right base atelectasis. Lungs elsewhere clear. Stable cardiac silhouette. Electronically Signed   By: Bretta Bang III M.D.   On: 05/07/2018 09:02    Assessment/Plan: S/P Procedure(s) (LRB): CORONARY ARTERY BYPASS GRAFTING (CABG) X ONE USING RIGHT INTERNAL MAMMARY ARTERY (N/A) TRANSESOPHAGEAL ECHOCARDIOGRAM (TEE) (N/A)  1 doing well pod #4 CABG x1 2 appears to be defervescing, Tmax 99.7,  UA  unremarkable. Leukocytosis is stable, some increased neutrophils (9.9),  and monocytes(reactive) 3 Remains tachy- increase metoprolol to 50 BID 4 cont pulm toilet, cardiac rehab  5 home soon     LOS: 4 days    Rowe ClackWayne E Gold PA-C 05/08/2018 Pager [phone]   I have seen and examined the patient and agree with the assessment and plan as outlined.  Looks good and feels ready for d/c home.  Still tachycardic.  Will check ECHO to r/o pericardial effusion - assuming this is not the case will increase beta  blocker further.  Possible d/c home later today if ECHO okay.  Purcell Nailslarence H , MD 05/08/2018 8:52 AM

## 2018-05-08 NOTE — Progress Notes (Signed)
Pt provided with discharged instructions and prescriptions. Pt iv removed and intact. Tele box removed/CCMD notified. Pt denies any complaints. Wife at bedside wth all belongings including phone and charge. Volunteers called to tx pt via wheelchair to ED entrance. Lacy DuverneyJennifer Vanshika Jastrzebski, RN

## 2018-05-08 NOTE — Progress Notes (Signed)
Pts HR 130 ST in bed, awaiting echo and beta blocker. Discussed ed with pt and wife, good reception. Discussed mobility at home, sternal precautions, IS, ex gl, and CRPII. [facility]. We will try to follow up later to ambulate. [phone] Ethelda ChickKristan Jalayla Chrismer CES, ACSM 10:07 AM 05/08/2018

## 2018-05-08 NOTE — Discharge Instructions (Signed)
Coronary Artery Bypass Grafting, Care After °These instructions give you information on caring for yourself after your procedure. Your doctor may also give you more specific instructions. Call your doctor if you have any problems or questions after your procedure. °Follow these instructions at home: °· Only take medicine as told by your doctor. Take medicines exactly as told. Do not stop taking medicines or start any new medicines without talking to your doctor first. °· Take your pulse as told by your doctor. °· Do deep breathing as told by your doctor. Use your breathing device (incentive spirometer), if given, to practice deep breathing several times a day. Support your chest with a pillow or your arms when you take deep breaths or cough. °· Keep the area clean, dry, and protected where the surgery cuts (incisions) were made. Remove bandages (dressings) only as told by your doctor. If strips were applied to surgical area, do not take them off. They fall off on their own. °· Check the surgery area daily for puffiness (swelling), redness, or leaking fluid. °· If surgery cuts were made in your legs: °? Avoid crossing your legs. °? Avoid sitting for long periods of time. Change positions every 30 minutes. °? Raise your legs when you are sitting. Place them on pillows. °· Wear stockings that help keep blood clots from forming in your legs (compression stockings). °· Only take sponge baths until your doctor says it is okay to take showers. Pat the surgery area dry. Do not rub the surgery area with a washcloth or towel. Do not bathe, swim, or use a hot tub until your doctor says it is okay. °· Eat foods that are high in fiber. These include raw fruits and vegetables, whole grains, beans, and nuts. Choose lean meats. Avoid canned, processed, and fried foods. °· Drink enough fluids to keep your pee (urine) clear or pale yellow. °· Weigh yourself every day. °· Rest and limit activity as told by your doctor. You may be told  to: °? Stop any activity if you have chest pain, shortness of breath, changes in heartbeat, or dizziness. Get help right away if this happens. °? Move around often for short amounts of time or take short walks as told by your doctor. Gradually become more active. You may need help to strengthen your muscles and build endurance. °? Avoid lifting, pushing, or pulling anything heavier than 10 pounds (4.5 kg) for at least 6 weeks after surgery. °· Do not drive until your doctor says it is okay. °· Ask your doctor when you can go back to work. °· Ask your doctor when you can begin sexual activity again. °· Follow up with your doctor as told. °Contact a doctor if: °· You have puffiness, redness, more pain, or fluid draining from the incision site. °· You have a fever. °· You have puffiness in your ankles or legs. °· You have pain in your legs. °· You gain 2 or more pounds (0.9 kg) a day. °· You feel sick to your stomach (nauseous) or throw up (vomit). °· You have watery poop (diarrhea). °Get help right away if: °· You have chest pain that goes to your jaw or arms. °· You have shortness of breath. °· You have a fast or irregular heartbeat. °· You notice a "clicking" in your breastbone when you move. °· You have numbness or weakness in your arms or legs. °· You feel dizzy or light-headed. °This information is not intended to replace advice given to you by   your health care provider. Make sure you discuss any questions you have with your health care provider. °Document Released: 06/11/2013 Document Revised: 11/12/2015 Document Reviewed: 11/13/2012 °Elsevier Interactive Patient Education © 2017 Else[employer] Inc. ° °

## 2018-05-08 NOTE — Progress Notes (Signed)
  Echocardiogram 2D Echocardiogram has been performed.  Jerry Harper 05/08/2018, 12:02 PM

## 2018-05-08 NOTE — Discharge Summary (Signed)
Physician Discharge Summary  Patient ID: Jerry Harper MRN: [id number] DOB/AGE: 29-Feb-1992 29 y.o.  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  Discharge Diagnoses:  Principal Problem:   S/P CABG x 1 Active Problems:   Anomalous origin of right coronary artery   Essential hypertension  HPI:  Patient is a 27 year old African-American male with history of exertional chest pain, shortness of breath, dizziness, and syncope who has been referred for surgical consultation to discuss treatment options for management of anomalous right coronary artery.  Patient states that he first began to experience symptoms of exertional chest pain in 2011. He describes occasional episodes of "tearing" pain across his chest that is usually associated with strenuous physical exertion and relieved by rest. Symptoms do not always occur when he is exerting himself, but they have become problematic and fairly frequent. The patient also experiences shortness of breath, although the shortness of breath does not necessarily coincide with episodes of chest pain or strenuous activity. The patient has occasional palpitations dizzy spells. He has had at least 2 syncopal episodes. Syncopal episodes were not preceded by strenuous activity. His first syncopal episode occurred in December 2016. 1 month later he underwent an echocardiogram that was normal. The patient has been seen in the emergency room on several occasions, each time with normal EKGs and blood work. Troponins have always been 0. He saw a cardiologist in 2018 and reportedly underwent another echocardiogram that was reportedly normal. Routine chest x-rays have been normal.  Patient presented to the emergency room January 14, 2018 with another episode of chest pain that was sudden onset and associated with lightheadedness although the patient  did not completely lose conscious. He was given aspirin and nitroglycerin by EMS but symptoms were not relieved. EKG was normal and troponins negative. Chest x-ray was clear. Patient was referred to Dr. Royston CowperNishanand subsequently underwent cardiac gated CT angiogram of the heart. 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 This was associated with a course that ran between the aorta and the pulmonary artery and felt potentially a set up for compression of the first portion of the right coronary artery and a potential source of angina and/or myocardial ischemia. A nuclear stress test was performed andreportedly low risk.Cardiothoracic surgical consultation was requested.  Following full evaluation of the patient and his studies Dr. Cornelius Moraswen recommended proceeding with surgical revascularization and the patient was admitted electively for the procedure.   Discharged Condition: good  Hospital Course: Patient was admitted electively and on 05/04/2018 taken the operating room where he underwent the below described procedure.  He tolerated the procedure well was taken to the surgical intensive care unit in stable condition.  Postoperative hospital course:  The patient is overall progressed well.  He has maintained stable hemodynamics, however has had sinus tachycardia.  We have gradually increased his metoprolol dose and this is showing some improvement.  An echocardiogram has been performed and shows normal Efx with no pericardial effusion, no significant valvular disease. He did have some postoperative fevers with no obvious source and he has defervesced. It is felt this was primarily related to systemic inflammatory response and atelectasis.  Urinalysis was unremarkable. Over time he is tolerating gradually increasing activities using standard cardiac rehab protocols.  Incisions are healing well without evidence of infection.  He is tolerating diet.  Oxygen has  been weaned and he maintains good saturations  on room air.Marland Kitchen  Urinalysis was unremarkable.  All routine lines, monitors and drainage devices have been discontinued in  a standard fashion.  At time of discharge the patient is felt to be quite stable.  Consults: None     Significant Diagnostic Studies: Routine postoperative serial chest x-rays and labs.  Treatments: surgery:  CARDIOTHORACIC SURGERY OPERATIVE NOTE  Date of Procedure:    05/04/2018  Preoperative Diagnosis:      Anomalous Right Coronary Artery  Postoperative Diagnosis:    Same  Procedure:        Coronary Artery Bypass Grafting x 1              Right Internal Mammary Artery to Distal RightDescending Coronary Artery  Surgeon:        Salvatore Decent. Cornelius Moras, MD  Assistant:       Rowe Clack, PA-C  Anesthesia:    Leslye Peer, MD  Operative Findings: ? Normal left ventricular systolic function ? Good quality right internal mammary artery conduit ? Good quality target vessel for grafting  Discharge Exam: Blood pressure 119/87, pulse (!) 122, temperature 98.3 F (36.8 C), temperature source Oral, resp. rate 18, height 5\' 11"  (1.803 m), weight 78.8 kg, SpO2 97 %.  General appearance: alert, cooperative and no distress Heart: regular rate and rhythm and tachy Lungs: clear to auscultation bilaterally Abdomen: benign Extremities: no edema Wound: incis healing well   Disposition: Discharge disposition: 01-Home or Self Care       Discharge Instructions    Amb Referral to Cardiac Rehabilitation   Complete by:  As directed    To Malva Cogan   Diagnosis:  CABG   CABG X ___:  1   Discharge patient   Complete by:  As directed    Discharge disposition:  01-Home or Self Care   Discharge patient date:  05/08/2018     Allergies as of 05/08/2018      Reactions   Peanut-containing Drug Products Hives, Swelling   Throat swelling      Medication List    TAKE these medications   acetaminophen 325  MG tablet Commonly known as:  TYLENOL Take 2 tablets (650 mg total) by mouth every 6 (six) hours as needed for mild pain.   aspirin 81 MG EC tablet Take 1 tablet (81 mg total) by mouth daily. Start taking on:  05/09/2018   metoprolol tartrate 50 MG tablet Commonly known as:  LOPRESSOR Take 1 tablet (50 mg total) by mouth 2 (two) times daily.   oxyCODONE 5 MG immediate release tablet Commonly known as:  Oxy IR/ROXICODONE Take 1-2 tablets (5-10 mg total) by mouth every 6 (six) hours as needed for up to 7 days for severe pain.      Follow-up Information    [facility] Department. Go on 05/14/2018.   Why:  at 8:30am. Bring proof of all household income. Bring all medication bottles Be prepared to pay at least $25; payment will be based on a sliding scale; you will meet with a financial counselor to determine financial assistance prior to your appointment.  Contact information: [address] [place], [place] [address] Phone: [phone]       Purcell Nails, MD Follow up.   Specialty:  Cardiothoracic Surgery Why:  Appointment to see the surgeon on June 04, 2018 at 4 PM.  Please obtain a chest x-ray at [facility] imaging at 3:30 PM.  [facility] imaging is located in the same office complex on the first floor.  Contact information: [address] [place] [address] [phone]        Wendall Stade, MD Follow up.   Specialty:  Cardiology Why:  Please see discharge paperwork for 2-week follow-up with cardiology. Contact information: [address] [place] [address] [phone]         The patient has been discharged on:   1.Beta Blocker:  Yes [  y ]                              No   [   ]                              If No, reason:  2.Ace Inhibitor/ARB: Yes [   ]                                     No  [ n   ]                                     If No, reason:soft BP, no h/o htn  3.Statin:   Yes [   ]                   No  [ n  ]                  If No, reason:anomalous RCA- not classic CAD or hyperlipidemia  4.Marlowe KaysValentino Hue  [place]ienne   ]                  No   [   ]                  If No, reason:   Signed: Ericah Scotto E Marqual Mi PA-C 05/08/2018, 1:35 PM

## 2018-05-08 NOTE — Progress Notes (Signed)
Pt sutures removed. Pt tolerated well. All incisions painted with Betadine.  Jerry DuverneyJennifer Yohance Harper

## 2018-05-08 NOTE — Progress Notes (Signed)
Pt's HR has sustained between 110-120s sinus rhythm throughout shift. Pt is asymptomatic but when active,  HR jumps into 140s. BP and other vital signs stable.

## 2018-05-22 ENCOUNTER — Ambulatory Visit: Payer: Self-pay | Admitting: Nurse Practitioner

## 2018-05-22 DIAGNOSIS — R0989 Other specified symptoms and signs involving the circulatory and respiratory systems: Secondary | ICD-10-CM

## 2018-05-22 NOTE — Progress Notes (Deleted)
CARDIOLOGY OFFICE NOTE  Date:  05/22/2018    Jerry Bastoserrence Loomis Date of Birth: Apr 02, 1991 Medical Record #[id number]  PCP:  Patient, [name]  Cardiologist:  Eden EmmsNishan  No chief complaint on file.   History of Present Illness: Jerry Harper is a 27 y.o. male who presents today for a post hospital visit. Seen for Dr. Eden EmmsNishan.   He has a history of exertional chest pain since 2011, shortness of breath, dizziness, and syncope. Found to have an anomalous right coronary artery on prior CT scan.   Patient presented to the emergency room January 14, 2018 with another episode of chest pain that was sudden onset and associated with lightheadedness although the patient did not completely lose conscious. He was given aspirin and nitroglycerin by EMS but symptoms were not relieved. EKG was normal and troponins negative. Chest x-ray was clear. Patient was referred to Dr. Royston CowperNishanand subsequently underwent cardiac gated CT angiogram of the heart. 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 This was associated with a course that ran between the aorta and the pulmonary artery and felt potentially a set up for compression of the first portion of the right coronary artery and a potential source of angina and/or myocardial ischemia. A nuclear stress test was performed andreportedly low risk.Cardiothoracic surgical consultation was requested.  Following full evaluation of the patient and his studies Dr. Cornelius Moraswen recommended proceeding with surgical revascularization and the patient was admitted electively for the procedure.The patient is overall progressed well.  He has maintained stable hemodynamics, however has had sinus tachycardia.  We have gradually increased his metoprolol dose and this is showing some improvement.  An echocardiogram has been performed and shows normal Efx with no pericardial effusion, no significant valvular disease. He did have some  postoperative fevers with no obvious source and he has defervesced. It is felt this was primarily related to systemic inflammatory response and atelectasis.  Urinalysis was unremarkable. Over time he is tolerating gradually increasing activities using standard cardiac rehab protocols.  Incisions are healing well without evidence of infection.  He is tolerating diet.  Oxygen has been weaned and he maintains good saturations on room air.Marland Kitchen.  Urinalysis was unremarkable.  All routine lines, monitors and drainage devices have been discontinued in  a standard fashion.  At time of discharge the patient is felt to be quite stable.  Comes in today. Here with   Past Medical History:  Diagnosis Date  . Anomalous origin of right coronary artery   . Dyspnea   . Hypertension     Past Surgical History:  Procedure Laterality Date  . CORONARY ARTERY BYPASS GRAFT N/A 05/04/2018   Procedure: CORONARY ARTERY BYPASS GRAFTING (CABG) X ONE USING RIGHT INTERNAL MAMMARY ARTERY;  Surgeon: Purcell Nailswen, Clarence H, MD;  Location: [facility] OR;  Service: Open Heart Surgery;  Laterality: N/A;  . TEE WITHOUT CARDIOVERSION N/A 05/04/2018   Procedure: TRANSESOPHAGEAL ECHOCARDIOGRAM (TEE);  Surgeon: Purcell Nailswen, Clarence H, MD;  [facility];  Service: Open Heart Surgery;  Laterality: N/A;  . TONSILLECTOMY       Medications: No outpatient medications have been marked as taking for the 05/22/18 encounter (Appointment) with Rosalio MacadamiaGerhardt, Adamary Savary C, NP.     Allergies: Allergies  Allergen Reactions  . Peanut-Containing Drug Products Hives and Swelling    Throat swelling    Social History: The patient  reports that he has never smoked. He has never used smokeless tobacco. He reports that he does  not drink alcohol or use drugs.   Family History: The patient's ***family history includes Sickle cell anemia in his father.   Review of Systems: Please see the history of present illness.   Otherwise, the review of systems is positive for {NONE  DEFAULTED:18576::"none"}.   All other systems are reviewed and negative.   Physical Exam: VS:  There were no vitals taken for this visit. Marland Kitchen  BMI There is no height or weight on file to calculate BMI.  Wt Readings from Last 3 Encounters:  05/08/18 173 lb 11.6 oz (78.8 kg)  04/30/18 181 lb (82.1 kg)  04/30/18 181 lb (82.1 kg)    General: Pleasant. Well developed, well nourished and in no acute distress.   HEENT: Normal.  Neck: Supple, no JVD, carotid bruits, or masses noted.  Cardiac: ***Regular rate and rhythm. No murmurs, rubs, or gallops. No edema.  Respiratory:  Lungs are clear to auscultation bilaterally with normal work of breathing.  GI: Soft and nontender.  MS: No deformity or atrophy. Gait and ROM intact.  Skin: Warm and dry. Color is normal.  Neuro:  Strength and sensation are intact and no gross focal deficits noted.  Psych: Alert, appropriate and with normal affect.   LABORATORY DATA:  EKG:  EKG {ACTION; IS/IS ZOX:09604540} ordered today. This demonstrates ***.  Lab Results  Component Value Date   WBC 12.9 (H) 05/08/2018   HGB 12.9 (L) 05/08/2018   HCT 37.4 (L) 05/08/2018   PLT 229 05/08/2018   GLUCOSE 100 (H) 05/07/2018   ALT 16 04/30/2018   AST 17 04/30/2018   NA 138 05/07/2018   K 3.5 05/07/2018   CL 101 05/07/2018   CREATININE 1.11 05/07/2018   BUN 8 05/07/2018   CO2 27 05/07/2018   INR 1.33 05/04/2018   HGBA1C 4.4 (L) 04/30/2018     BNP (last 3 results) No results for input(s): BNP in the last 8760 hours.  ProBNP (last 3 results) No results for input(s): PROBNP in the last 8760 hours.   Other Studies Reviewed Today:   Assessment/Plan:   Current medicines are reviewed with the patient today.  The patient does not have concerns regarding medicines other than what has been noted above.  The following changes have been made:  See above.  Labs/ tests ordered today include:   No orders of the defined types were placed in this  encounter.    Disposition:   FU with *** in {gen number 9-81:191478} {Days to years:10300}.   Patient is agreeable to this plan and will call if any problems develop in the interim.   SignedNorma Fredrickson, NP  05/22/2018 7:25 AM  [facility] [address] [place], [place]  [address] Phone: [phone] Fax: [phone]

## 2018-05-23 ENCOUNTER — Encounter: Payer: Self-pay | Admitting: Nurse Practitioner

## 2018-06-04 ENCOUNTER — Ambulatory Visit: Payer: Self-pay | Admitting: Thoracic Surgery (Cardiothoracic Vascular Surgery)

## 2018-06-04 ENCOUNTER — Other Ambulatory Visit: Payer: Self-pay | Admitting: Thoracic Surgery (Cardiothoracic Vascular Surgery)

## 2018-06-04 DIAGNOSIS — Z951 Presence of aortocoronary bypass graft: Secondary | ICD-10-CM

## 2018-06-04 NOTE — Progress Notes (Unsigned)
cxr 

## 2018-06-05 ENCOUNTER — Encounter: Payer: Self-pay | Admitting: Thoracic Surgery (Cardiothoracic Vascular Surgery)

## 2018-06-18 ENCOUNTER — Encounter: Payer: Self-pay | Admitting: Thoracic Surgery (Cardiothoracic Vascular Surgery)

## 2018-06-18 ENCOUNTER — Ambulatory Visit (INDEPENDENT_AMBULATORY_CARE_PROVIDER_SITE_OTHER): Payer: Self-pay | Admitting: Thoracic Surgery (Cardiothoracic Vascular Surgery)

## 2018-06-18 ENCOUNTER — Ambulatory Visit
Admission: RE | Admit: 2018-06-18 | Discharge: 2018-06-18 | Disposition: A | Discharge status: Home / Self Care | Payer: Self-pay | Source: Ambulatory Visit | Attending: Thoracic Surgery (Cardiothoracic Vascular Surgery) | Admitting: Thoracic Surgery (Cardiothoracic Vascular Surgery)

## 2018-06-18 VITALS — BP 107/75 | HR 90 | Resp 20 | Ht 71.0 in | Wt 172.0 lb

## 2018-06-18 DIAGNOSIS — Z951 Presence of aortocoronary bypass graft: Secondary | ICD-10-CM

## 2018-06-18 DIAGNOSIS — I2510 Atherosclerotic heart disease of native coronary artery without angina pectoris: Secondary | ICD-10-CM

## 2018-06-18 DIAGNOSIS — Q245 Malformation of coronary vessels: Secondary | ICD-10-CM

## 2018-06-18 MED ORDER — TRAMADOL HCL 50 MG PO TABS: 50.0000 mg | ORAL_TABLET | Freq: Four times a day (QID) | ORAL | 0 refills | Status: DC | PRN

## 2018-06-18 NOTE — Progress Notes (Signed)
[address]       [place] [id number]             [phone]     CARDIOTHORACIC SURGERY OFFICE NOTE  Referring Provider is Wendall StadeNishan, Peter C, MD PCP is Patient, [name]   HPI:  Patient is a 27 year old African-American male with history of exertional chest pain, shortness of breath, dizziness, and syncope who returns to the office today for routine follow-up status post coronary artery bypass grafting x1 using right internal mammary artery to the distal right coronary artery for anomalous right coronary artery.  The patient's early postoperative recovery was uneventful and he was discharged home on the fourth postoperative day.  He missed his transition of care office appointment at Dr. Fabio BeringNishan's office that was scheduled for May 22, 2018.  Several days later he was seen briefly in the emergency room with soreness in his chest that was clearly musculoskeletal.  The patient explained that that day he had lifted his child several times because the babysitter did not show up to assist, and he was concerned because of his acute exacerbation of soreness in his chest.  This has resolved.  Since then the patient has been doing quite well.  He still has soreness in his chest but continues to improve.  He has no shortness of breath.  Appetite is good.   Current Outpatient Medications  Medication Sig Dispense Refill  . acetaminophen (TYLENOL) 325 MG tablet Take 2 tablets (650 mg total) by mouth every 6 (six) hours as needed for mild pain.    Marland Kitchen. aspirin EC 81 MG EC tablet Take 1 tablet (81 mg total) by mouth daily.    . metoprolol tartrate (LOPRESSOR) 50 MG tablet Take 1 tablet (50 mg total) by mouth 2 (two) times daily. 60 tablet 1   No current facility-administered medications for this visit.       Physical Exam:   BP 107/75   Pulse 90   Resp 20   Ht 5\' 11"  (1.803 m)   Wt 172 lb (78 kg)   SpO2 97% Comment: RA  BMI 23.99 kg/m    General:  Well-appearing  Chest:   Clear to auscultation  CV:   Regular rate and rhythm without murmur  Incisions:  Healing nicely, sternum is stable  Abdomen:  Soft nontender  Extremities:  Warm and well-perfused  Diagnostic Tests:  CHEST - 2 VIEW  COMPARISON:  PA and lateral chest x-ray of May 07, 2018  FINDINGS: The lungs are well-expanded. The tiny right apical pneumothorax has resolved. The bilateral pleural effusions have resolved. The cardiac silhouette is normal in size. The pulmonary vascularity is not engorged. The sternal wires are intact. The bony thorax exhibits no acute abnormality.  IMPRESSION: There is no active cardiopulmonary disease.   Electronically Signed   By: David  SwazilandJordan M.D.   On: 06/18/2018 09:59   Impression:  Patient is doing very well approximately 1 month status post coronary artery bypass grafting for anomalous right coronary artery  Plan:  I have encouraged the patient to continue to gradually increase his physical activity but I have reminded him to refrain from any heavy lifting or strenuous use of his arms or shoulders for at least another 2 months.  As long as he has not requiring any oral narcotic pain relievers I think he may resume driving an automobile.  I have encouraged him to enroll and participate in the cardiac rehab program.  We will make sure that a follow-[facility] is [facility].  The patient will continue to follow-up with Dr. Eden EmmsNishan and return to our office for routine follow-up next November, approximately 1 year following his surgery.  He will call and return sooner should specific problems or questions arise.    Salvatore Decentlarence H. Cornelius Moraswen, MD 06/18/2018 10:16 AM

## 2018-06-18 NOTE — Patient Instructions (Addendum)
Continue to avoid any heavy lifting or strenuous use of your arms or shoulders for at least a total of three months from the time of surgery.  After three months you may gradually increase how much you lift or otherwise use your arms or chest as tolerated, with limits based upon whether or not activities lead to the return of significant discomfort.  You may return to driving an automobile as long as you are no longer requiring oral narcotic pain relievers during the daytime.  It would be wise to start driving only short distances during the daylight and gradually increase from there as you feel comfortable.  Continue all previous medications without any changes at this time  You are encouraged to enroll and participate in the outpatient cardiac rehab program beginning as soon as practical.  

## 2018-08-31 DIAGNOSIS — Z736 Limitation of activities due to disability: Secondary | ICD-10-CM

## 2018-11-09 DIAGNOSIS — I10 Essential (primary) hypertension: Secondary | ICD-10-CM | POA: Insufficient documentation

## 2018-11-09 DIAGNOSIS — Q245 Malformation of coronary vessels: Secondary | ICD-10-CM | POA: Insufficient documentation

## 2019-04-28 NOTE — Progress Notes (Signed)
Cardiology Office Note   Date:  05/02/2019   ID:  Jerry Harper, DOB 01/08/1991, MRN [id number]  PCP:  Patient, [name]  Cardiologist:   Charlton Haws, MD   No chief complaint on file.     History of Present Illness: Jerry Harper is a 28 y.o. male who presents for f/u regarding chest pain First seen by me 01/23/18 . Referred by Dr Narda Bonds ER. Reviewed ER note from 01/14/18  Sudden onset dyspnea and sharp chest pain around 5:30 pm going to his car. Got lightheaded and "collapsed" no loss of consciousness Did having some nausea Similar issues in past with negative cardiac w/u including TTE ASA and nitro given by EMS did not help Chest pain was reproducible to palpation over left sternal border D dimer and ECG normal negative troponin x 2 Rx with NSAI's  CXR showed NAD  TTE reviewed from 06/24/15 normal EF 55-60%   Still with pain at home not responsive to NSAI's   F/U cardiac CT  03/05/18 reviewed. 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   In talking with him he has had chest pain, dyspnea and pre syncopal episodes since 2011  Long discussion with him and wife regarding diagnosis. 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 And acute take off angle. Also discussed various methods of surgical correction including relocation, by pass and un-roofing.   He subsequently was seen by Dr Cornelius Moras and had single vessel bypass with RIMA to distal RCA 05/04/18  Pre CABG dopplers and ABI's normal   Seems depressed Not working Living in [facility] In laws seem upset with his slow recovery Has 2 yo daughter     Past Medical History:  Diagnosis Date  . Anomalous origin of right coronary artery   .  Dyspnea   . Hypertension     Past Surgical History:  Procedure Laterality Date  . CORONARY ARTERY BYPASS GRAFT N/A 05/04/2018   Procedure: CORONARY ARTERY BYPASS GRAFTING (CABG) X ONE USING RIGHT INTERNAL MAMMARY ARTERY;  Surgeon: Purcell Nails, MD;  Location: [facility] OR;  Service: Open Heart Surgery;  Laterality: N/A;  . TEE WITHOUT CARDIOVERSION N/A 05/04/2018   Procedure: TRANSESOPHAGEAL ECHOCARDIOGRAM (TEE);  Surgeon: Purcell Nails, MD;  Location: [facility] OR;  Service: Open Heart Surgery;  Laterality: N/A;  . TONSILLECTOMY       No current outpatient medications on file.   No current facility-administered medications for this visit.     Allergies:   Peanut-containing drug products    Social History:  The patient  reports that he has never smoked. He has never used smokeless tobacco. He reports that he does not drink alcohol or use drugs.   Family History:  The patient's family history includes Sickle cell anemia in his father.    ROS:  Please see the history of present illness.   Otherwise, review of systems are positive for none.   All other systems are reviewed and negative.    PHYSICAL EXAM: VS:  BP 100/68   Pulse 84   Ht 5\' 11"  (1.803 m)   Wt 167 lb (75.8 kg)   SpO2 98%   BMI 23.29 kg/m  , BMI Body mass index  is 23.29 kg/m. Affect appropriate Healthy:  appears stated age 1: normal Neck supple with no adenopathy JVP normal no bruits no thyromegaly Lungs clear with no wheezing and good diaphragmatic motion Heart:  S1/S2 no murmur, no rub, gallop or click PMI normal post sternotomy  Abdomen: benighn, BS positve, no tenderness, no AAA no bruit.  No HSM or HJR Distal pulses intact with no bruits No edema Neuro non-focal Skin warm and dry No muscular weakness    EKG:  05/02/19 SR rate 92 LAE LVH    Recent Labs: 05/05/2018: Magnesium 1.9 05/07/2018: BUN 8; Creatinine, Ser 1.11; Potassium 3.5; Sodium 138 05/08/2018: Hemoglobin 12.9; Platelets 229     Lipid Panel No results found for: CHOL, TRIG, HDL, CHOLHDL, VLDL, LDLCALC, LDLDIRECT    Wt Readings from Last 3 Encounters:  05/02/19 167 lb (75.8 kg)  06/18/18 172 lb (78 kg)  05/08/18 173 lb 11.6 oz (78.8 kg)      Other studies Reviewed: Additional studies/ records that were reviewed today include: Notes ER, labs CXR and ECG . Cardiac CTA 03/05/18  Echo 05/08/18    ASSESSMENT AND PLAN:  1.  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 HTN:  Low sodium diet follow readings at home normal in office today   3. Dyspnea:  Normal exam and CXR CT with no lung findings observe Echo 05/08/18 EF 60-65%     Current medicines are reviewed at length with the patient today.  The patient does not have concerns regarding medicines.  The following changes have been made:  no change  Labs/ tests ordered today include: None   Orders Placed This Encounter  Procedures  . EKG 12-Lead     Disposition:   FU with cardiology in a year     Signed, Jenkins Rouge, MD  05/02/2019 11:12 AM    [address], [place], [place]  [address] Phone: [phone]; Fax: [phone]

## 2019-05-02 ENCOUNTER — Ambulatory Visit (INDEPENDENT_AMBULATORY_CARE_PROVIDER_SITE_OTHER): Payer: Self-pay | Admitting: Cardiovascular Disease

## 2019-05-02 ENCOUNTER — Other Ambulatory Visit: Payer: Self-pay

## 2019-05-02 ENCOUNTER — Encounter (INDEPENDENT_AMBULATORY_CARE_PROVIDER_SITE_OTHER): Payer: Self-pay

## 2019-05-02 ENCOUNTER — Encounter: Payer: Self-pay | Admitting: Cardiovascular Disease

## 2019-05-02 VITALS — BP 100/68 | HR 84 | Ht 71.0 in | Wt 167.0 lb

## 2019-05-02 DIAGNOSIS — I2510 Atherosclerotic heart disease of native coronary artery without angina pectoris: Secondary | ICD-10-CM

## 2019-05-02 NOTE — Patient Instructions (Signed)
Medication Instructions:   *If you need a refill on your cardiac medications before your next appointment, please call your pharmacy*  Lab Work:  If you have labs (blood work) drawn today and your tests are completely normal, you will receive your results only by: . MyChart Message (if you have MyChart) OR . A paper copy in the mail If you have any lab test that is abnormal or we need to change your treatment, we will call you to review the results.  Testing/Procedures:   Follow-Up: At [facility], you and your health needs are our priority.  As part of our continuing mission to provide you with exceptional heart care, we have created designated Provider Care Teams.  These Care Teams include your primary Cardiologist (physician) and Advanced Practice Providers (APPs -  Physician Assistants and Nurse Practitioners) who all work together to provide you with the care you need, when you need it.  Your next appointment:   12 month(s)  The format for your next appointment:   In Person  Provider:   You may see Dr. Nishan or one of the following Advanced Practice Providers on your designated Care Team:    Lori Gerhardt, NP  Laura Ingold, NP  Jill McDaniel, NP   

## 2019-05-06 ENCOUNTER — Other Ambulatory Visit: Payer: Self-pay

## 2019-05-06 ENCOUNTER — Telehealth (INDEPENDENT_AMBULATORY_CARE_PROVIDER_SITE_OTHER): Payer: Self-pay | Admitting: Thoracic Surgery (Cardiothoracic Vascular Surgery)

## 2019-05-06 DIAGNOSIS — Z951 Presence of aortocoronary bypass graft: Secondary | ICD-10-CM

## 2019-05-06 NOTE — Patient Instructions (Signed)
Continue all previous medications without any changes at this time  

## 2019-05-06 NOTE — Progress Notes (Signed)
      [address]       [place] [address]             [phone]     CARDIOTHORACIC SURGERY TELEPHONE VIRTUAL OFFICE NOTE  Referring Provider is Josue Hector, MD PCP is Patient, [name]   HPI:  I spoke with Jerry Harper (DOB 1990/09/20 ) via telephone on 05/06/2019 at 3:13 PM and verified that I was speaking with the correct person using more than one form of identification.  We discussed the reason(s) for conducting our visit virtually instead of in-person.  The patient expressed understanding the circumstances and agreed to proceed as described.   Patient is a 28 year old African-American male with history of exertional chest pain, shortness of breath, dizziness, and syncope who returns to the office today for routine follow-up status post coronary artery bypass grafting x1 using right internal mammary artery to the distal right coronary artery for anomalous right coronary artery.  The patient's early postoperative recovery was uneventful and he was last seen here in our office on June 18, 2018 at which time he was recovering uneventfully.  He was seen in the office recently by Dr. Johnsie Cancel.    Patient reports that since his surgery he has not had any exertional chest tightness, palpitations, or syncopal events.  He states that recently he has had some swelling in his mouth that he believes is related to an infected tooth.  He is not currently working and he is disappointed that he was turned down for long-term disability.   No current outpatient medications on file.   No current facility-administered medications for this visit.      Diagnostic Tests:  n/a   Impression:  Patient seems to have recovered uneventfully from single-vessel coronary artery bypass grafting 1 year ago.  He denies any ongoing symptoms of exertional chest pain, palpitations, dizzy spells, or syncope.    Plan:  Patient will continue to follow-up intermittently with Dr.  Johnsie Cancel.  We have not recommended any changes to medical therapy and the patient understands that there are no contraindications from a surgical standpoint to unrestricted physical activity.  All questions answered.    I discussed limitations of evaluation and management via telephone.  The patient was advised to call back for repeat telephone consultation or to seek an in-person evaluation if questions arise or the patient's clinical condition changes in any significant manner.  I spent in excess of 10 minutes of non-face-to-face time during the conduct of this telephone virtual office consultation.    Jerry Gu. Roxy Manns, MD 05/06/2019 3:13 PM

## 2019-05-13 ENCOUNTER — Ambulatory Visit: Payer: Self-pay | Admitting: Thoracic Surgery (Cardiothoracic Vascular Surgery)

## 2019-05-20 ENCOUNTER — Telehealth: Payer: Self-pay | Admitting: Cardiovascular Disease

## 2019-05-20 NOTE — Telephone Encounter (Signed)
Patient would like to speak to someone about his disability forms that were completed.  He would like to know what was written on them.

## 2019-05-20 NOTE — Telephone Encounter (Signed)
There is no disability paperwork on file with this office.

## 2019-05-20 NOTE — Telephone Encounter (Signed)
Called patient back to let him know that our office did not receive any paper work.

## 2019-07-28 DIAGNOSIS — U071 COVID-19: Secondary | ICD-10-CM | POA: Insufficient documentation

## 2019-07-29 MED ORDER — NITROGLYCERIN 0.4 MG SL SUBL
0.40 | SUBLINGUAL_TABLET | SUBLINGUAL | Status: DC
Start: ? — End: 2019-07-29

## 2019-07-29 MED ORDER — ASPIRIN 81 MG PO TBEC
81.00 | DELAYED_RELEASE_TABLET | ORAL | Status: DC
Start: 2019-07-30 — End: 2019-07-29

## 2019-07-29 MED ORDER — ACETAMINOPHEN 325 MG PO TABS
650.00 | ORAL_TABLET | ORAL | Status: DC
Start: ? — End: 2019-07-29

## 2019-07-29 MED ORDER — ONDANSETRON 4 MG PO TBDP
4.00 | ORAL_TABLET | ORAL | Status: DC
Start: ? — End: 2019-07-29

## 2019-07-29 MED ORDER — HEPARIN SODIUM (PORCINE) 5000 UNIT/ML IJ SOLN
5000.00 | INTRAMUSCULAR | Status: DC
Start: 2019-07-29 — End: 2019-07-29

## 2019-08-11 NOTE — Progress Notes (Deleted)
Cardiology Office Note   Date:  08/11/2019   ID:  Jerry Harper, DOB 01-Jun-1991, MRN [id number]  PCP:  Patient, [name]  Cardiologist:  Dr. Johnsie Cancel, MD   No chief complaint on file.     History of Present Illness: Jerry Harper is a 29 y.o. male who presents for hospital follow-up, seen for Dr. Johnsie Cancel.   Jerry Harper has a hx of CAD s/p single vessel bypass with RIMA to distal RCA 05/04/2018, HTN and chronic dyspnea. Prior to CABG, he was having recurrent chest pain at which time a cardiac CT was performed which showed an anomalous RCA coming from left cusp adjacent to or sharing common ostium with LM. Appears to have elliptical slit like orifice and comes off left sinus at acute angle and courses between Jerry aortic root and MPA.  Echocardiogram 05/08/2018 showed an LVEF of 60 to 65%.  Unfortunately,  Jerry Harper was seen in Jerry emergency department 07/28/2019 at [facility] with complaints of chest pain.  ACS ruled out with no acute ST changes on EKG, nonspecific T wave changes were thought to be in Jerry setting of hypokalemia at which time potassium was repleted.  Repeat EKG showed no acute abnormality.  Troponin levels were negative x3.  Patient's chest pain was reproducible with palpation.  He reported he has had chest pain since 11/2018.  Also reported he was on metoprolol and oxycodone.  Per chart review felt to be slow recovery from CABG.  He was previously referred for physical therapy however never received it.  He was prescribed ASA and metoprolol most recent as he reported that he had run out.  He was given no narcotics on discharge.    1.  CAD status post single-vessel CABG with RIMA to distal RCA 04/2018: -Patient had recurrent chest pain with syncope and high risk features of anomalous RCA, status post single-vessel CABG 04/2017 -Continue ASA, beta-blocker  2.  Hypertension: -Stable, -Continue  3.  Dyspnea: -   Past Medical  History:  Diagnosis Date  . Anomalous origin of right coronary artery   . Dyspnea   . Hypertension     Past Surgical History:  Procedure Laterality Date  . CORONARY ARTERY BYPASS GRAFT N/A 05/04/2018   Procedure: CORONARY ARTERY BYPASS GRAFTING (CABG) X ONE USING RIGHT INTERNAL MAMMARY ARTERY;  Surgeon: Rexene Alberts, MD;  Location: [place];  Service: Open Heart Surgery;  Laterality: N/A;  . TEE WITHOUT CARDIOVERSION N/A 05/04/2018   Procedure: TRANSESOPHAGEAL ECHOCARDIOGRAM (TEE);  Surgeon: Rexene Alberts, MD;  Location: [place];  Service: Open Heart Surgery;  Laterality: N/A;  . TONSILLECTOMY       No current outpatient medications on file.   No current facility-administered medications for this visit.    Allergies:   Peanut-containing drug products    Social History:  Jerry patient  reports that he has never smoked. He has never used smokeless tobacco. He reports that he does not drink alcohol or use drugs.   Family History:  Jerry patient'sfamily history includes Sickle cell anemia in his father.    ROS:  Please see Jerry history of present illness. Otherwise, review of systems are positive for none.   All other systems are reviewed and negative.    PHYSICAL EXAM: VS:  There were no vitals taken for this visit. , BMI There is no height or weight on file to calculate BMI.    General: Well developed, well nourished, NAD Skin:  Warm, dry, intact  Head: Normocephalic, atraumatic, sclera non-icteric, no xanthomas, clear, moist mucus membranes. Neck: Negative for carotid bruits. No JVD Lungs:Clear to ausculation bilaterally. No wheezes, rales, or rhonchi. Breathing is unlabored. Cardiovascular: RRR with S1 S2. No murmurs, rubs, gallops, or LV heave appreciated. Abdomen: Soft, non-tender, non-distended with normoactive bowel sounds. No hepatomegaly, No rebound/guarding. No obvious abdominal masses. MSK: Strength and tone appear normal for age. 5/5 in all extremities Extremities:  No edema. No clubbing or cyanosis. DP/PT pulses 2+ bilaterally Neuro: Alert and oriented. No focal deficits. No facial asymmetry. MAE spontaneously. Psych: Responds to questions appropriately with normal affect.      EKG:  EKG {ACTION; IS/IS YWV:[id number]} ordered today. Jerry ekg ordered today demonstrates ***   Recent Labs: No results found for requested labs within last 8760 hours.    Lipid Panel No results found for: CHOL, TRIG, HDL, CHOLHDL, VLDL, LDLCALC, LDLDIRECT    Wt Readings from Last 3 Encounters:  05/02/19 167 lb (75.8 kg)  06/18/18 172 lb (78 kg)  05/08/18 173 lb 11.6 oz (78.8 kg)      Other studies Reviewed: Additional studies/ records that were reviewed today include: ***. Review of Jerry above records demonstrates: ***   ASSESSMENT AND PLAN:  1.  ***   Current medicines are reviewed at length with Jerry patient today.  Jerry patient {ACTIONS; HAS/DOES NOT HAVE:19233} concerns regarding medicines.  Jerry following changes have been made:  {PLAN; NO CHANGE:13088:s}  Labs/ tests ordered today include: *** No orders of Jerry defined types were placed in this encounter.    Disposition:   FU with *** in {gen number 4-85:462703} {Days to years:10300}  Signed, Georgie Chard, NP  08/11/2019 8:00 AM    [facility] [address] [place], [place], [place]  [address] Phone: [phone]; Fax: [phone]

## 2019-08-13 ENCOUNTER — Ambulatory Visit: Payer: Self-pay | Admitting: Cardiology

## 2019-10-11 DIAGNOSIS — K810 Acute cholecystitis: Secondary | ICD-10-CM | POA: Insufficient documentation

## 2020-01-30 IMAGING — CT CT HEART MORP W/ CTA COR W/ SCORE W/ CA W/CM &/OR W/O CM
1 series · 1 of 1 positions shown, 2 images · non-contrast
Comparison: None.

CLINICAL DATA: Chest pain

EXAM:
Cardiac CTA
MEDICATIONS:
Sub lingual nitro. 4mg and lopressor 10mg
TECHNIQUE: The patient was scanned on a Siemens Force [REDACTED]ice scanner. Gantry
rotation speed was 250 msecs. Collimation was. 6 mm . A 100 kV
prospective scan was triggered in the ascending thoracic aorta at
140 HU's with full mA between 30-70% R-R interval. Average HR during
the scan was 69 bpm. The 3D data set was interpreted on a dedicated
work station using NPR, MIP and VRT modes. A total of 80 cc of
contrast was used.

[Series 365: anomalous rca · 0.09mm/px · 1 of 1 slices shown, 2 images]
[im 1/1  vessel]
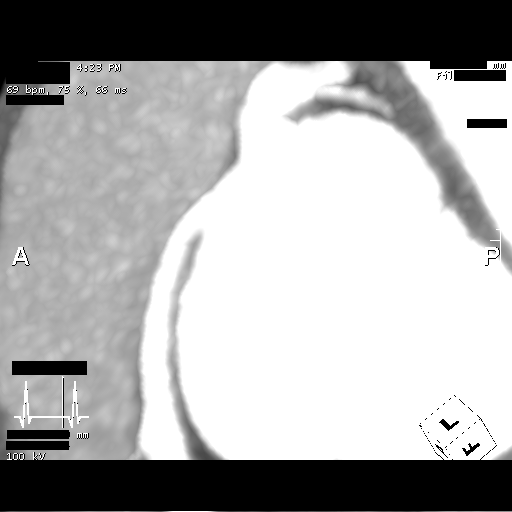
[im 1/1  lung]
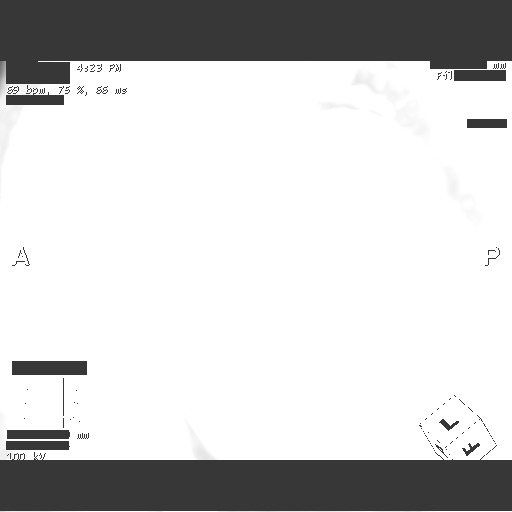

[1 of 1 positions shown; findings below may reference images not displayed]

FINDINGS: Non-cardiac: See separate report from [REDACTED]. No
significant findings on limited lung and soft tissue windows.

Calcium Score: No calcium detected

Coronary Arteries: Right dominant

LM: Normal comes off left cusp

LAD: Normal

D1: Normal

D2: Normal

D3: Normal

Circumflex: Normal

OM1: Normal

AV Groove: Normal

RCA: Appears to have an anomalous origin from the left cusp either
abutting or common ostium to LM. Proximal vessel somewhat slit like
and runs between the MPA and aorta

PDA: Normal

PLA: Normal
IMPRESSION: 1. Calcium score 0

2.  Normal aortic root 2.9 cm

3. Anomalous RCA origin from left cusp abutting or common ostium to
LM Course does run between the MPA and aortic root suggesting
possible source of angina/chest pain

Will review with colleagues

Yudith Tong

EXAM:
OVER-READ INTERPRETATION  CT CHEST

The following report is an over-read performed by radiologist Dr.
Caleb Aujla [REDACTED] on 03/05/2018. This over-read
does not include interpretation of cardiac or coronary anatomy or
pathology. The coronary CTA interpretation by the cardiologist is
attached.
FINDINGS: Vascular: Heart is normal size.  Visualized aorta is normal caliber.

Mediastinum/Nodes: No adenopathy in the lower mediastinum or hila.

Lungs/Pleura: Visualized lungs clear.  No effusions.

Upper Abdomen: Imaging into the upper abdomen shows no acute
findings.

Musculoskeletal: Chest wall soft tissues are unremarkable. No acute
bony abnormality.
IMPRESSION: No acute or significant extracardiac abnormality.

## 2020-11-17 ENCOUNTER — Telehealth: Payer: Self-pay | Admitting: Cardiology

## 2020-11-17 NOTE — Telephone Encounter (Addendum)
Spoke to the patient and advised that the doctor was not in the [address] office today but we would reach out to him for work letter. He is wanting to start a new job and they need a letter stating he has no restrictions. He is also over due for follow up. Scheduled for 12/07/20.

## 2020-11-17 NOTE — Telephone Encounter (Signed)
Patient called and mentioned that he needed medical release form for personal use and to send to [phone]. Need it asap because he needs it to start new job.

## 2020-11-17 NOTE — Telephone Encounter (Signed)
Patient says to please call [phone]

## 2020-11-17 NOTE — Telephone Encounter (Signed)
Left message to call back  

## 2020-11-23 NOTE — Telephone Encounter (Signed)
Left message to call back regarding work release.

## 2020-11-26 NOTE — Telephone Encounter (Signed)
Unable to leave message Will try later./cy  

## 2020-11-27 NOTE — Telephone Encounter (Signed)
Attempted to call pt once again and unable to leave message mailbox full Pt has appt with Dr Eden Emms later this month Will address message at that time .Zack Seal

## 2020-12-02 NOTE — Progress Notes (Unsigned)
Cardiology Office Note   Date:  12/02/2020   ID:  Jerry Harper, DOB 08/04/1990, MRN [id number]  PCP:  Jerry Harper, [name] (Inactive)  Cardiologist:   Charlton Haws, MD   No chief complaint on file.     History of Present Illness:  30 y.o. 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 Had elliptical slit like orifice and came of left sinus at acute angle with course between the aortic root and MPA.   In talking with him he has had chest pain, dyspnea and pre syncopal episodes since 2011   He subsequently was seen by Dr Cornelius Moras and had single vessel bypass with RIMA to distal RCA 05/04/18  Pre CABG dopplers and ABI's normal   Seems depressed Not working Living in [facility] In laws seem upset with his slow recovery Has 71 yo daughter Was turned down for long term disability Seen by Dr Cornelius Moras 05/06/19 with normal check up    ***    Past Medical History:  Diagnosis Date   Anomalous origin of right coronary artery    Dyspnea    Hypertension     Past Surgical History:  Procedure Laterality Date   CORONARY ARTERY BYPASS GRAFT N/A 05/04/2018   Procedure: CORONARY ARTERY BYPASS GRAFTING (CABG) X ONE USING RIGHT INTERNAL MAMMARY ARTERY;  Surgeon: Purcell Nails, MD;  Location: [facility];  Service: Open Heart Surgery;  Laterality: N/A;   TEE WITHOUT CARDIOVERSION N/A 05/04/2018   Procedure: TRANSESOPHAGEAL ECHOCARDIOGRAM (TEE);  Surgeon: Purcell Nails, MD;  Location: [facility] OR;  Service: Open Heart Surgery;  Laterality: N/A;   TONSILLECTOMY       No current outpatient medications on file.   No current facility-administered medications for this visit.    Allergies:   Peanut-containing drug products    Social History:  The Jerry Harper  reports that he has never smoked. He has never used smokeless tobacco. He reports that he does not drink alcohol and does not use drugs.    Family History:  The Jerry Harper's family history includes Sickle cell anemia in his father.    ROS:  Please see the history of present illness.   Otherwise, review of systems are positive for none.   All other systems are reviewed and negative.    PHYSICAL EXAM: VS:  There were no vitals taken for this visit. , BMI There is no height or weight on file to calculate BMI. Affect appropriate Healthy:  appears stated age HEENT: normal Neck supple with no adenopathy JVP normal no bruits no thyromegaly Lungs clear with no wheezing and good diaphragmatic motion Heart:  S1/S2 no murmur, no rub, gallop or click PMI normal post sternotomy  Abdomen: benighn, BS positve, no tenderness, no AAA no bruit.  No HSM or HJR Distal pulses intact with no bruits No edema Neuro non-focal Skin warm and dry No muscular weakness    EKG:  05/02/19 SR rate 92 LAE LVH    Recent Labs: No results found for requested labs within last 8760 hours.    Lipid Panel No results found for: CHOL, TRIG, HDL, CHOLHDL, VLDL, LDLCALC, LDLDIRECT    Wt Readings from Last 3 Encounters:  05/02/19 75.8 kg  06/18/18 78 kg  05/08/18 78.8 kg      Other studies Reviewed: Additional studies/ records that were reviewed today include: Notes ER, labs CXR and ECG .  Cardiac CTA 03/05/18  Echo 05/08/18    ASSESSMENT AND PLAN:  1.  CAD: Anomalous RCA with high risk features and recurrent SSCP/Syncope post single vessel RIMA to distal RCA by Dr Cornelius Moras 05/04/18  Continue ASA and beta blocker   2. HTN:  Low sodium diet follow readings at home normal in office today   3. Dyspnea:  Normal exam and CXR CT with no lung findings observe Echo 05/08/18 EF 60-65%     Current medicines are reviewed at length with the Jerry Harper today.  The Jerry Harper does not have concerns regarding medicines.  The following changes have been made:  no change  Labs/ tests ordered today include: None   No orders of the defined types were placed in  this encounter.    Disposition:   FU with cardiology in a year     Signed, Charlton Haws, MD  12/02/2020 4:39 PM    [facility] [address] [place], [place], [place]  [address] Phone: [phone]; Fax: [phone]

## 2020-12-02 NOTE — Telephone Encounter (Addendum)
First number listed can not be completed at this time. Second number voicemail is full Third number just rings with no answer or voicemail.   Attempting to call to make sure patient knows appointment is virtual on Monday.

## 2020-12-03 NOTE — Telephone Encounter (Signed)
Unable to leave voicemail.

## 2020-12-07 ENCOUNTER — Telehealth: Payer: Self-pay | Admitting: Cardiovascular Disease

## 2020-12-07 ENCOUNTER — Other Ambulatory Visit: Payer: Self-pay

## 2021-08-27 ENCOUNTER — Telehealth: Payer: Self-pay | Admitting: *Deleted

## 2021-08-27 NOTE — Telephone Encounter (Signed)
? ?  Pre-operative Risk Assessment  ?  ?Patient Name: Jerry Harper  ?DOB: 04-23-1991 ?MRN: [id number]  ? ? ? ?Request for Surgical Clearance ?   ? ?Procedure:   5 TEETH TO BE EXTRACTED AS WELL AS BONE GRAFT AND SINUS LIFT ? ?Date of Surgery:  Clearance TBD                              ?   ?Surgeon:  DR. Algis Greenhouse, DMD ?Surgeon's Group or Practice Name:  [facility] ?Phone number:  [phone] ?Fax number:  [phone] ?  ?Type of Clearance Requested:   ?- Medical  ?  ?Type of Anesthesia:   IV SEDATION; PROPOFOL, VERSED AND FENTANYL  ?  ?Additional requests/questions:   ? ?Signed, ?Danielle Rankin   ?08/27/2021, 2:02 PM  ? ?

## 2021-08-27 NOTE — Telephone Encounter (Signed)
NEED TO CONFIRM SPELLING OF PT'S LAST NAME AS THE DENTAL OFFICE HAS LAST NAME SPELLED AS [place]. I DID CONFIRM THE DOB AND ADDRESS FOR THE PT. PT WILL ALSO NEED AN APPT FOR IN PERSON APPT AS WE HAVE NOT SEEN THE PT SINCE 04/2019. LEFT MESSAGE TO CALL BACK AND SCHEDULE APPT WITH DR. Johnsie Cancel OR APP FOR PRE OP CLEARANCE.  ?

## 2021-08-30 NOTE — Telephone Encounter (Signed)
I was able to s/w the pt today. I first did confirm the spelling of his last name. Per the pt our office  does have the correct spelling ([place]).  ? ?Pt has been scheduled to see Robbie Lis, [facility] 09/09/21 @ 1:55 pre op appt. I will forward notes to [facility] for upcoming appt. Will send FYI to requesting office the pt has appt 09/09/21.  ?

## 2021-09-09 ENCOUNTER — Ambulatory Visit: Payer: Self-pay | Admitting: Physician Assistant

## 2021-09-09 NOTE — Progress Notes (Deleted)
?Cardiology Office Note:   ? ?Date:  09/09/2021  ? ?ID:  Jerry Harper, DOB 1990/12/04, MRN [id number] ? ?PCP:  Patient, [name] (Inactive)  ?[facility] Cardiologist:  None  ?[place] Electrophysiologist:  None  ? ?Chief Complaint: Surgical clearance  ? ?History of Present Illness:   ? ?Jerry Harper is a 31 y.o. male with a hx of anomalous RCA s/p RIMA to dRCA and HTN presents for follow up. He is schedule for 5 TEETH TO BE EXTRACTED AS WELL AS BONE GRAFT AND SINUS LIFT.  ? ?Hx of syncope and chest pain.  ?Work up with Echo 06/2015 showing normal LVEF at 55-60%.  ?Cardiac CT 03/05/18: Anomalous RCA origin from left cusp abutting or common ostium to LM Course does run between the MPA and aortic root suggesting possible source of angina/chest pain ?He subsequently seen by Dr Roxy Manns and had single vessel bypass with RIMA to distal RCA 05/04/18  ?Pre CABG dopplers and ABI's normal  ?  ?Echo 04/2018 showed LVEF of 60-65%. No regional WM abnormality.  ? ? ?Past Medical History:  ?Diagnosis Date  ? Anomalous origin of right coronary artery   ? Dyspnea   ? Hypertension   ? ? ?Past Surgical History:  ?Procedure Laterality Date  ? CORONARY ARTERY BYPASS GRAFT N/A 05/04/2018  ? Procedure: CORONARY ARTERY BYPASS GRAFTING (CABG) X ONE USING RIGHT INTERNAL MAMMARY ARTERY;  Surgeon: Rexene Alberts, MD;  Location: [place];  Service: Open Heart Surgery;  Laterality: N/A;  ? TEE WITHOUT CARDIOVERSION N/A 05/04/2018  ? Procedure: TRANSESOPHAGEAL ECHOCARDIOGRAM (TEE);  Surgeon: Rexene Alberts, MD;  Location: [place];  Service: Open Heart Surgery;  Laterality: N/A;  ? TONSILLECTOMY    ? ? ?Current Medications: ?No outpatient medications have been marked as taking for the 09/09/21 encounter (Appointment) with Leanor Kail, [employer].  ?  ? ?Allergies:   Peanut-containing drug products  ? ?Social History  ? ?Socioeconomic History  ? Marital status: Married  ?  Spouse name: Not on file  ? Number of children: Not on file  ?  Years of education: Not on file  ? Highest education level: Not on file  ?Occupational History  ? Not on file  ?Tobacco Use  ? Smoking status: Never  ? Smokeless tobacco: Never  ?Vaping Use  ? Vaping Use: Never used  ?Substance and Sexual Activity  ? Alcohol use: No  ? Drug use: No  ? Sexual activity: Not on file  ?Other Topics Concern  ? Not on file  ?Social History Narrative  ? Not on file  ? ?Social Determinants of Health  ? ?Financial Resource Strain: Not on file  ?Food Insecurity: Not on file  ?Transportation Needs: Not on file  ?Physical Activity: Not on file  ?Stress: Not on file  ?Social Connections: Not on file  ?  ? ?Family History: ?The patient's family history includes Sickle cell anemia in his father.  *** ? ?ROS:   ?Please see the history of present illness.    ?All other systems reviewed and are negative. *** ? ?EKGs/Labs/Other Studies Reviewed:   ? ?The following studies were reviewed today: ? ?Echo 04/2018 ?Study Conclusions  ? ?- Left ventricle: The cavity size was normal. Systolic function was  ?  normal. The estimated ejection fraction was in the range of 60%  ?  to 65%. Wall motion was normal; there were no regional wall  ?  motion abnormalities. The study is not technically sufficient to  ?  allow evaluation of LV diastolic function.  ?- Aortic valve: There was no regurgitation.  ?- Mitral valve: There was trivial regurgitation.  ?- Right ventricle: The cavity size was normal. Wall thickness was  ?  normal. Systolic function was normal.  ?- Right atrium: The atrium was normal in size.  ?- Tricuspid valve: There was no regurgitation.  ?- Pulmonary arteries: Systolic pressure could not be accurately  ?  estimated.  ?- Inferior vena cava: The vessel was normal in size.  ?- Pericardium, extracardiac: There was no pericardial effusion.  ? ?Coronary CT 02/2018 ?IMPRESSION: ?1. Calcium score 0 ?  ?2.  Normal aortic root 2.9 cm ?  ?3. Anomalous RCA origin from left cusp abutting or common ostium to ?LM  Course does run between the MPA and aortic root suggesting ?possible source of angina/chest pain ?  ?Will review with colleagues ?  ?Jenkins Rouge ? ?EKG:  EKG is *** ordered today.  The ekg ordered today demonstrates *** ? ?Recent Labs: ?No results found for requested labs within last 8760 hours.  ?Recent Lipid Panel ?No results found for: CHOL, TRIG, HDL, CHOLHDL, VLDL, LDLCALC, LDLDIRECT ? ? ?Risk Assessment/Calculations:   ?{Does this patient have ATRIAL FIBRILLATION?:[phone]} ? ? ?Physical Exam:   ? ?VS:  There were no vitals taken for this visit.   ? ?Wt Readings from Last 3 Encounters:  ?05/02/19 167 lb (75.8 kg)  ?06/18/18 172 lb (78 kg)  ?05/08/18 173 lb 11.6 oz (78.8 kg)  ?  ? ?GEN: *** Well nourished, well developed in no acute distress ?HEENT: Normal ?NECK: No JVD; No carotid bruits ?LYMPHATICS: No lymphadenopathy ?CARDIAC: ***RRR, no murmurs, rubs, gallops ?RESPIRATORY:  Clear to auscultation without rales, wheezing or rhonchi  ?ABDOMEN: Soft, non-tender, non-distended ?MUSCULOSKELETAL:  No edema; No deformity  ?SKIN: Warm and dry ?NEUROLOGIC:  Alert and oriented x 3 ?PSYCHIATRIC:  Normal affect  ? ?ASSESSMENT AND PLAN:  ? ? ?*** ? ?2. *** ? ?Medication Adjustments/Labs and Tests Ordered: ?Current medicines are reviewed at length with the patient today.  Concerns regarding medicines are outlined above.  ?No orders of the defined types were placed in this encounter. ? ?No orders of the defined types were placed in this encounter. ? ? ?There are no Patient Instructions on file for this visit.  ? ?Signed, ?Leanor Kail, [place]  ?09/09/2021 1:05 PM    ?[place] ?

## 2022-06-30 ENCOUNTER — Other Ambulatory Visit: Payer: Self-pay

## 2022-06-30 ENCOUNTER — Encounter (HOSPITAL_COMMUNITY): Payer: Self-pay

## 2022-06-30 ENCOUNTER — Emergency Department (HOSPITAL_COMMUNITY)
Admission: EM | Admit: 2022-06-30 | Discharge: 2022-06-30 | Disposition: A | Discharge status: Home / Self Care | Payer: Self-pay | Attending: Emergency Medicine | Admitting: Emergency Medicine

## 2022-06-30 ENCOUNTER — Emergency Department (HOSPITAL_COMMUNITY): Payer: Self-pay

## 2022-06-30 DIAGNOSIS — Z1152 Encounter for screening for COVID-19: Secondary | ICD-10-CM | POA: Insufficient documentation

## 2022-06-30 DIAGNOSIS — K047 Periapical abscess without sinus: Secondary | ICD-10-CM

## 2022-06-30 DIAGNOSIS — L03213 Periorbital cellulitis: Secondary | ICD-10-CM

## 2022-06-30 DIAGNOSIS — H02846 Edema of left eye, unspecified eyelid: Secondary | ICD-10-CM | POA: Insufficient documentation

## 2022-06-30 DIAGNOSIS — Z951 Presence of aortocoronary bypass graft: Secondary | ICD-10-CM | POA: Insufficient documentation

## 2022-06-30 DIAGNOSIS — H5712 Ocular pain, left eye: Secondary | ICD-10-CM | POA: Insufficient documentation

## 2022-06-30 DIAGNOSIS — Z91010 Allergy to peanuts: Secondary | ICD-10-CM | POA: Insufficient documentation

## 2022-06-30 DIAGNOSIS — I10 Essential (primary) hypertension: Secondary | ICD-10-CM | POA: Insufficient documentation

## 2022-06-30 DIAGNOSIS — Z79899 Other long term (current) drug therapy: Secondary | ICD-10-CM | POA: Insufficient documentation

## 2022-06-30 LAB — BASIC METABOLIC PANEL
Anion gap: 10 (ref 5–15)
BUN: 7 mg/dL (ref 6–20)
CO2: 23 mmol/L (ref 22–32)
Calcium: 9.1 mg/dL (ref 8.9–10.3)
Chloride: 104 mmol/L (ref 98–111)
Creatinine, Ser: 0.99 mg/dL (ref 0.61–1.24)
GFR, Estimated: >60 mL/min (ref >60)
Glucose, Bld: 95 mg/dL (ref 70–99)
Potassium: 3.8 mmol/L (ref 3.5–5.1)
Sodium: 137 mmol/L (ref 135–145)

## 2022-06-30 LAB — RESP PANEL BY RT-PCR (RSV, FLU A&B, COVID)  RVPGX2
Influenza A by PCR: NEGATIVE
Influenza B by PCR: NEGATIVE
Resp Syncytial Virus by PCR: NEGATIVE
SARS Coronavirus 2 by RT PCR: NEGATIVE

## 2022-06-30 LAB — CBC
HCT: 42.7 % (ref 39.0–52.0)
Hemoglobin: 14.5 g/dL (ref 13.0–17.0)
MCH: 31.4 pg (ref 26.0–34.0)
MCHC: 34 g/dL (ref 30.0–36.0)
MCV: 92.4 fL (ref 80.0–100.0)
Platelets: 255 10*3/uL (ref 150–400)
RBC: 4.62 MIL/uL (ref 4.22–5.81)
RDW: 12.4 % (ref 11.5–15.5)
WBC: 12.3 10*3/uL — ABNORMAL HIGH (ref 4.0–10.5)
nRBC: 0 % (ref 0.0–0.2)

## 2022-06-30 LAB — GROUP A STREP BY PCR: Group A Strep by PCR: NOT DETECTED

## 2022-06-30 LAB — TROPONIN I (HIGH SENSITIVITY): Troponin I (High Sensitivity): <2 ng/L (ref <18)

## 2022-06-30 MED ORDER — CLINDAMYCIN HCL 300 MG PO CAPS: 300.0000 mg | ORAL_CAPSULE | Freq: Three times a day (TID) | ORAL | 0 refills | Status: AC

## 2022-06-30 MED ORDER — IOHEXOL 350 MG/ML SOLN
75.0000 mL | Freq: Once | INTRAVENOUS | Status: AC | PRN
  Administered 2022-06-30: 75 mL via INTRAVENOUS

## 2022-06-30 MED ORDER — OXYCODONE-ACETAMINOPHEN 5-325 MG PO TABS: 1.0000 | ORAL_TABLET | Freq: Three times a day (TID) | ORAL | 0 refills | Status: AC | PRN

## 2022-06-30 MED ORDER — OXYCODONE-ACETAMINOPHEN 5-325 MG PO TABS
1.0000 | ORAL_TABLET | Freq: Once | ORAL | Status: AC
  Administered 2022-06-30: 1 via ORAL
  Filled 2022-06-30: qty 1

## 2022-06-30 MED ORDER — IBUPROFEN 600 MG PO TABS: 600.0000 mg | ORAL_TABLET | Freq: Four times a day (QID) | ORAL | 0 refills | Status: AC | PRN

## 2022-06-30 MED ORDER — CLINDAMYCIN HCL 150 MG PO CAPS
450.0000 mg | ORAL_CAPSULE | Freq: Once | ORAL | Status: AC
  Administered 2022-06-30: 450 mg via ORAL
  Filled 2022-06-30: qty 3

## 2022-06-30 MED ORDER — ACETAMINOPHEN 500 MG PO TABS
1000.0000 mg | ORAL_TABLET | Freq: Once | ORAL | Status: AC
  Administered 2022-06-30: 1000 mg via ORAL
  Filled 2022-06-30: qty 2

## 2022-06-30 NOTE — ED Triage Notes (Signed)
Pt arrived POV from home c/o facial swelling and pain when he swallows that started on Tuesday with a toothache so he is unsure if it is related. Pt states it is causing a headache and dizziness as well. Pt was headed out of town for a wedding today.

## 2022-06-30 NOTE — ED Provider Notes (Signed)
[facility] EMERGENCY DEPARTMENT Provider Note   CSN: [id number] Arrival date & time: 06/30/22  1343     History  Chief Complaint  Patient presents with   Facial Swelling    Jerry Harper is a 32 y.o. male.  Patient presents to the emergency department complaining of left eye swelling.  Patient states that over the past week or 2 he has had some issues with tooth pain and has taken Tylenol and ibuprofen.  On Tuesday the patient began to notice facial swelling.  He states the majority of the swelling is under the left eye.  He does endorse some increase in pain with chewing but also endorses pain with extraocular movements.  He denies fevers, nausea, vomiting.  Past medical history significant for hypertension, CABG  HPI     Home Medications Prior to Admission medications   Medication Sig Start Date End Date Taking? Authorizing Provider  amoxicillin (AMOXIL) 500 MG capsule Take 500 mg by mouth every 8 (eight) hours. 07/19/21   [provider]  metoprolol tartrate (LOPRESSOR) 25 MG tablet Take 1 tablet by mouth 2 (two) times daily. 10/15/19   [provider]      Allergies    Peanut-containing drug products    Review of Systems   Review of Systems  Eyes:  Negative for visual disturbance.       Swelling under left eye, pain with eye movement    Physical Exam Updated Vital Signs BP 130/88   Pulse 91   Temp 98.2 F (36.8 C) (Oral)   Resp 13   Ht 5\' 11"  (1.803 m)   Wt 81.6 kg   SpO2 100%   BMI 25.10 kg/m  Physical Exam Vitals and nursing note reviewed.  Constitutional:      General: He is not in acute distress.    Appearance: He is well-developed.  HENT:     Head: Normocephalic and atraumatic.     Mouth/Throat:     Comments: Poor dentition noted, no swelling or abscess appreciated Eyes:     Conjunctiva/sclera: Conjunctivae normal.     Comments: Extraocular movements intact, pain felt in the left eye with extraocular  movements.  Swelling noted under left eye  Cardiovascular:     Rate and Rhythm: Normal rate and regular rhythm.     Heart sounds: No murmur heard. Pulmonary:     Effort: Pulmonary effort is normal. No respiratory distress.     Breath sounds: Normal breath sounds.  Abdominal:     Palpations: Abdomen is soft.     Tenderness: There is no abdominal tenderness.  Musculoskeletal:        General: No swelling.     Cervical back: Neck supple.  Skin:    General: Skin is warm and dry.     Capillary Refill: Capillary refill takes less than 2 seconds.  Neurological:     Mental Status: He is alert.  Psychiatric:        Mood and Affect: Mood normal.     ED Results / Procedures / Treatments   Labs (all labs ordered are listed, but only abnormal results are displayed) Labs Reviewed  CBC - Abnormal; Notable for the following components:      Result Value   WBC 12.3 (*)    All other components within normal limits  GROUP A STREP BY PCR  RESP PANEL BY RT-PCR (RSV, FLU A&B, COVID)  [id number]  BASIC METABOLIC PANEL  TROPONIN I (HIGH SENSITIVITY)  EKG EKG Interpretation  Date/Time:  Thursday June 30 2022 14:05:55 EST Ventricular Rate:  93 PR Interval:  136 QRS Duration: 88 QT Interval:  324 QTC Calculation: 402 R Axis:   86 Text Interpretation: Sinus rhythm with sinus arrhythmia with occasional Premature ventricular complexes Right atrial enlargement When compared with ECG of 05-May-2018 07:22, PREVIOUS ECG IS PRESENT No STEMI Confirmed by Octaviano Glow [phone]) on 06/30/2022 4:24:09 PM  Radiology DG Chest 2 View  Result Date: 06/30/2022 CLINICAL DATA:  Pain and swelling. EXAM: CHEST - 2 VIEW COMPARISON:  05/12/2018 FINDINGS: Status post median sternotomy. Normal cardiopericardial silhouette. No consolidation, pneumothorax, effusion or edema. Grossly clear lungs. Surgical clips are seen in the upper abdomen on the lateral view. IMPRESSION: Postop chest.  No acute cardiopulmonary  disease Electronically Signed   By: Jill Side M.D.   On: 06/30/2022 14:49    Procedures Procedures    Medications Ordered in ED Medications  acetaminophen (TYLENOL) tablet 1,000 mg (1,000 mg Oral Given 06/30/22 1736)    ED Course/ Medical Decision Making/ A&P                           Medical Decision Making Amount and/or Complexity of Data Reviewed Radiology: ordered.  Risk OTC drugs.   This patient presents to the ED for concern of facial swelling, this involves an extensive number of treatment options, and is a complaint that carries with it a high risk of complications and morbidity.  The differential diagnosis includes periorbital cellulitis, orbital cellulitis, and others   Co morbidities that complicate the patient evaluation  History hypertension    Lab Tests:  I Ordered, and personally interpreted labs.  The pertinent results include:  WBC 12.3, rest of workup unremarkable   Imaging Studies ordered:  I ordered imaging studies including chest x-ray and CT maxillofacial with contrast I independently visualized and interpreted imaging which showed no acute disease on chest x-ray I agree with the radiologist interpretation   Cardiac Monitoring: / EKG:  The patient was maintained on a cardiac monitor.  I personally viewed and interpreted the cardiac monitored which showed an underlying rhythm of: Sinus rhythm with occasional PVCs  Problem List / ED Course / Critical interventions / Medication management   I ordered medication including Tylenol for pain Reevaluation of the patient after these medicines showed that the patient improved I have reviewed the patients home medicines and have made adjustments as needed  Test / Admission - Considered:  Patient care being transferred to Dr. Langston Masker at shift handoff.  Disposition likely discharge home with antibiotic therapy but pending results of CT maxillofacial        Final Clinical Impression(s) / ED  Diagnoses Final diagnoses:  None    Rx / DC Orders ED Discharge Orders     None         Ronny Bacon 06/30/22 1848    Wyvonnia Dusky, MD 06/30/22 [phone]

## 2022-06-30 NOTE — ED Provider Triage Note (Signed)
Emergency Medicine Provider Triage Evaluation Note  Jerry Harper , a 32 y.o. male  was evaluated in triage.  Pt complains of substernal chest pain.  Began on Tuesday and was intermittent.  Woke up today and found that it was constant.  Has also had intermittent dizziness and shortness of breath.  Feels similar to prior to his CABG in 2019.  Woke up today and noticed a sore throat as well.  States it feels like knives when he is swallowing.  Had tonsillectomy when he was 2.  No known history of strep.  Denies cough, fevers, chills  Review of Systems  Positive: As above Negative: As above  Physical Exam  BP (!) 133/91   Pulse 100   Temp 99.1 F (37.3 C) (Oral)   Resp 16   Ht 5\' 11"  (1.803 m)   Wt 81.6 kg   SpO2 99%   BMI 25.10 kg/m  Gen:   Awake, no distress   Resp:  Normal effort  MSK:   Moves extremities without difficulty  Other:  Posterior oropharynx erythematous.  Tonsils removed.  No exudates  Medical Decision Making  Medically screening exam initiated at 2:01 PM.  Appropriate orders placed.  Jerry Harper was informed that the remainder of the evaluation will be completed by another provider, this initial triage assessment does not replace that evaluation, and the importance of remaining in the ED until their evaluation is complete.  ACS rule out, respiratory panel, strep   Roylene Reason, PA-C 06/30/22 1402

## 2022-06-30 NOTE — Discharge Instructions (Addendum)
You have an infection of the skin around your eye, that likely was caused by cavities in your teeth.  Bacteria likely under your skin for your teeth.  Please continue to look for an oral surgeon or dentist in your local area who may be able to remove these broken teeth.  Otherwise there may remain a source of infection in the future.
